# Patient Record
Sex: Female | Born: 1950
Health system: Southern US, Community
[De-identification: ages and names within clinical notes are randomized; demographics above are authoritative.]

## PROBLEM LIST (undated history)

## (undated) DIAGNOSIS — E785 Hyperlipidemia, unspecified: Secondary | ICD-10-CM

## (undated) DIAGNOSIS — H68009 Unspecified Eustachian salpingitis, unspecified ear: Secondary | ICD-10-CM

## (undated) DIAGNOSIS — J302 Other seasonal allergic rhinitis: Secondary | ICD-10-CM

## (undated) DIAGNOSIS — E559 Vitamin D deficiency, unspecified: Secondary | ICD-10-CM

## (undated) DIAGNOSIS — S8992XA Unspecified injury of left lower leg, initial encounter: Secondary | ICD-10-CM

## (undated) DIAGNOSIS — K760 Fatty (change of) liver, not elsewhere classified: Secondary | ICD-10-CM

## (undated) DIAGNOSIS — Z780 Asymptomatic menopausal state: Secondary | ICD-10-CM

## (undated) DIAGNOSIS — M1711 Unilateral primary osteoarthritis, right knee: Secondary | ICD-10-CM

## (undated) DIAGNOSIS — R7989 Other specified abnormal findings of blood chemistry: Secondary | ICD-10-CM

## (undated) DIAGNOSIS — N959 Unspecified menopausal and perimenopausal disorder: Secondary | ICD-10-CM

## (undated) DIAGNOSIS — E890 Postprocedural hypothyroidism: Secondary | ICD-10-CM

## (undated) DIAGNOSIS — L237 Allergic contact dermatitis due to plants, except food: Secondary | ICD-10-CM

## (undated) DIAGNOSIS — H1031 Unspecified acute conjunctivitis, right eye: Secondary | ICD-10-CM

## (undated) DIAGNOSIS — J329 Chronic sinusitis, unspecified: Secondary | ICD-10-CM

## (undated) DIAGNOSIS — L03316 Cellulitis of umbilicus: Secondary | ICD-10-CM

## (undated) DIAGNOSIS — I10 Essential (primary) hypertension: Secondary | ICD-10-CM

## (undated) DIAGNOSIS — R5383 Other fatigue: Secondary | ICD-10-CM

## (undated) DIAGNOSIS — M2550 Pain in unspecified joint: Secondary | ICD-10-CM

## (undated) DIAGNOSIS — J029 Acute pharyngitis, unspecified: Secondary | ICD-10-CM

## (undated) DIAGNOSIS — G4700 Insomnia, unspecified: Secondary | ICD-10-CM

## (undated) DIAGNOSIS — E1165 Type 2 diabetes mellitus with hyperglycemia: Secondary | ICD-10-CM

## (undated) DIAGNOSIS — T465X5A Adverse effect of other antihypertensive drugs, initial encounter: Secondary | ICD-10-CM

## (undated) DIAGNOSIS — R002 Palpitations: Secondary | ICD-10-CM

## (undated) DIAGNOSIS — I889 Nonspecific lymphadenitis, unspecified: Secondary | ICD-10-CM

## (undated) DIAGNOSIS — E669 Obesity, unspecified: Secondary | ICD-10-CM

## (undated) DIAGNOSIS — M7120 Synovial cyst of popliteal space [Baker], unspecified knee: Secondary | ICD-10-CM

## (undated) DIAGNOSIS — I8390 Asymptomatic varicose veins of unspecified lower extremity: Secondary | ICD-10-CM

## (undated) DIAGNOSIS — L84 Corns and callosities: Secondary | ICD-10-CM

## (undated) HISTORY — DX: Other fatigue: R53.83

## (undated) HISTORY — DX: Asymptomatic varicose veins of unspecified lower extremity: I83.90

## (undated) HISTORY — DX: Unspecified injury of left lower leg, initial encounter: S89.92XA

## (undated) HISTORY — DX: Synovial cyst of popliteal space (Baker), unspecified knee: M71.20

## (undated) HISTORY — DX: Insomnia, unspecified: G47.00

## (undated) HISTORY — DX: Essential (primary) hypertension: I10

## (undated) HISTORY — DX: Vitamin D deficiency, unspecified: E55.9

## (undated) HISTORY — DX: Corns and callosities: L84

## (undated) HISTORY — DX: Unspecified eustachian salpingitis, unspecified ear: H68.009

## (undated) HISTORY — DX: Type 2 diabetes mellitus with hyperglycemia: E11.65

## (undated) HISTORY — DX: Unspecified menopausal and perimenopausal disorder: N95.9

## (undated) HISTORY — DX: Unilateral primary osteoarthritis, right knee: M17.11

## (undated) HISTORY — DX: Unspecified acute conjunctivitis, right eye: H10.31

## (undated) HISTORY — DX: Obesity, unspecified: E66.9

## (undated) HISTORY — DX: Other specified abnormal findings of blood chemistry: R79.89

## (undated) HISTORY — DX: Pain in unspecified joint: M25.50

## (undated) HISTORY — DX: Allergic contact dermatitis due to plants, except food: L23.7

## (undated) HISTORY — DX: Asymptomatic menopausal state: Z78.0

## (undated) HISTORY — PX: DILATION AND CURETTAGE OF UTERUS: SHX78

## (undated) HISTORY — DX: Hyperlipidemia, unspecified: E78.5

## (undated) HISTORY — DX: Fatty (change of) liver, not elsewhere classified: K76.0

## (undated) HISTORY — DX: Other seasonal allergic rhinitis: J30.2

## (undated) HISTORY — DX: Acute pharyngitis, unspecified: J02.9

## (undated) HISTORY — DX: Palpitations: R00.2

## (undated) HISTORY — DX: Nonspecific lymphadenitis, unspecified: I88.9

## (undated) HISTORY — DX: Chronic sinusitis, unspecified: J32.9

## (undated) HISTORY — DX: Postprocedural hypothyroidism: E89.0

## (undated) HISTORY — DX: Other fatigue: T46.5X5A

## (undated) HISTORY — DX: Cellulitis of umbilicus: L03.316

---

## 2007-06-23 ENCOUNTER — Encounter: Payer: Self-pay | Admitting: Endocrinology

## 2007-06-23 ENCOUNTER — Other Ambulatory Visit: Admission: RE | Admit: 2007-06-23 | Discharge: 2007-06-23 | Payer: Self-pay | Admitting: Endocrinology

## 2007-06-23 ENCOUNTER — Ambulatory Visit: Payer: Self-pay | Admitting: Endocrinology

## 2007-06-23 DIAGNOSIS — E039 Hypothyroidism, unspecified: Secondary | ICD-10-CM | POA: Insufficient documentation

## 2007-06-23 HISTORY — DX: Hypothyroidism, unspecified: E03.9

## 2007-06-25 DIAGNOSIS — E119 Type 2 diabetes mellitus without complications: Secondary | ICD-10-CM

## 2007-06-25 DIAGNOSIS — I10 Essential (primary) hypertension: Secondary | ICD-10-CM | POA: Insufficient documentation

## 2007-06-25 HISTORY — DX: Essential (primary) hypertension: I10

## 2007-06-25 HISTORY — DX: Type 2 diabetes mellitus without complications: E11.9

## 2007-06-29 ENCOUNTER — Telehealth: Payer: Self-pay | Admitting: Endocrinology

## 2007-07-24 ENCOUNTER — Encounter: Payer: Self-pay | Admitting: Endocrinology

## 2007-07-31 ENCOUNTER — Encounter: Payer: Self-pay | Admitting: Internal Medicine

## 2007-08-02 ENCOUNTER — Ambulatory Visit: Payer: Self-pay | Admitting: Endocrinology

## 2007-08-02 DIAGNOSIS — E890 Postprocedural hypothyroidism: Secondary | ICD-10-CM

## 2007-08-02 HISTORY — DX: Postprocedural hypothyroidism: E89.0

## 2016-02-24 DIAGNOSIS — Z6829 Body mass index (BMI) 29.0-29.9, adult: Secondary | ICD-10-CM | POA: Diagnosis not present

## 2016-02-24 DIAGNOSIS — L237 Allergic contact dermatitis due to plants, except food: Secondary | ICD-10-CM | POA: Diagnosis not present

## 2016-04-30 DIAGNOSIS — E119 Type 2 diabetes mellitus without complications: Secondary | ICD-10-CM | POA: Diagnosis not present

## 2016-04-30 DIAGNOSIS — E039 Hypothyroidism, unspecified: Secondary | ICD-10-CM | POA: Diagnosis not present

## 2016-04-30 DIAGNOSIS — E785 Hyperlipidemia, unspecified: Secondary | ICD-10-CM | POA: Diagnosis not present

## 2016-04-30 DIAGNOSIS — E559 Vitamin D deficiency, unspecified: Secondary | ICD-10-CM | POA: Diagnosis not present

## 2016-05-05 DIAGNOSIS — J309 Allergic rhinitis, unspecified: Secondary | ICD-10-CM | POA: Diagnosis not present

## 2016-05-05 DIAGNOSIS — I10 Essential (primary) hypertension: Secondary | ICD-10-CM | POA: Diagnosis not present

## 2016-05-05 DIAGNOSIS — E039 Hypothyroidism, unspecified: Secondary | ICD-10-CM | POA: Diagnosis not present

## 2016-05-05 DIAGNOSIS — E785 Hyperlipidemia, unspecified: Secondary | ICD-10-CM | POA: Diagnosis not present

## 2016-05-05 DIAGNOSIS — E559 Vitamin D deficiency, unspecified: Secondary | ICD-10-CM | POA: Diagnosis not present

## 2016-05-05 DIAGNOSIS — E119 Type 2 diabetes mellitus without complications: Secondary | ICD-10-CM | POA: Diagnosis not present

## 2016-08-03 DIAGNOSIS — Z23 Encounter for immunization: Secondary | ICD-10-CM | POA: Diagnosis not present

## 2016-11-29 DIAGNOSIS — M25511 Pain in right shoulder: Secondary | ICD-10-CM | POA: Diagnosis not present

## 2016-12-30 DIAGNOSIS — I10 Essential (primary) hypertension: Secondary | ICD-10-CM | POA: Diagnosis not present

## 2016-12-30 DIAGNOSIS — E119 Type 2 diabetes mellitus without complications: Secondary | ICD-10-CM | POA: Diagnosis not present

## 2016-12-30 DIAGNOSIS — E785 Hyperlipidemia, unspecified: Secondary | ICD-10-CM | POA: Diagnosis not present

## 2016-12-30 DIAGNOSIS — E890 Postprocedural hypothyroidism: Secondary | ICD-10-CM | POA: Diagnosis not present

## 2017-01-10 DIAGNOSIS — M25511 Pain in right shoulder: Secondary | ICD-10-CM | POA: Diagnosis not present

## 2017-01-18 DIAGNOSIS — Z1231 Encounter for screening mammogram for malignant neoplasm of breast: Secondary | ICD-10-CM | POA: Diagnosis not present

## 2017-02-10 DIAGNOSIS — N6321 Unspecified lump in the left breast, upper outer quadrant: Secondary | ICD-10-CM | POA: Diagnosis not present

## 2017-02-10 DIAGNOSIS — N6489 Other specified disorders of breast: Secondary | ICD-10-CM | POA: Diagnosis not present

## 2017-02-10 DIAGNOSIS — R928 Other abnormal and inconclusive findings on diagnostic imaging of breast: Secondary | ICD-10-CM | POA: Diagnosis not present

## 2017-03-05 DIAGNOSIS — M7061 Trochanteric bursitis, right hip: Secondary | ICD-10-CM | POA: Diagnosis not present

## 2017-03-29 DIAGNOSIS — H25813 Combined forms of age-related cataract, bilateral: Secondary | ICD-10-CM | POA: Diagnosis not present

## 2017-03-29 DIAGNOSIS — E119 Type 2 diabetes mellitus without complications: Secondary | ICD-10-CM | POA: Diagnosis not present

## 2017-04-11 DIAGNOSIS — Z9181 History of falling: Secondary | ICD-10-CM | POA: Diagnosis not present

## 2017-04-11 DIAGNOSIS — E890 Postprocedural hypothyroidism: Secondary | ICD-10-CM | POA: Diagnosis not present

## 2017-04-11 DIAGNOSIS — Z1389 Encounter for screening for other disorder: Secondary | ICD-10-CM | POA: Diagnosis not present

## 2017-04-11 DIAGNOSIS — E119 Type 2 diabetes mellitus without complications: Secondary | ICD-10-CM | POA: Diagnosis not present

## 2017-04-11 DIAGNOSIS — E785 Hyperlipidemia, unspecified: Secondary | ICD-10-CM | POA: Diagnosis not present

## 2017-04-11 DIAGNOSIS — Z139 Encounter for screening, unspecified: Secondary | ICD-10-CM | POA: Diagnosis not present

## 2017-04-11 DIAGNOSIS — I10 Essential (primary) hypertension: Secondary | ICD-10-CM | POA: Diagnosis not present

## 2017-04-11 DIAGNOSIS — E559 Vitamin D deficiency, unspecified: Secondary | ICD-10-CM | POA: Diagnosis not present

## 2017-07-21 DIAGNOSIS — E669 Obesity, unspecified: Secondary | ICD-10-CM | POA: Diagnosis not present

## 2017-07-21 DIAGNOSIS — E890 Postprocedural hypothyroidism: Secondary | ICD-10-CM | POA: Diagnosis not present

## 2017-07-21 DIAGNOSIS — Z23 Encounter for immunization: Secondary | ICD-10-CM | POA: Diagnosis not present

## 2017-07-21 DIAGNOSIS — I10 Essential (primary) hypertension: Secondary | ICD-10-CM | POA: Diagnosis not present

## 2017-07-21 DIAGNOSIS — E119 Type 2 diabetes mellitus without complications: Secondary | ICD-10-CM | POA: Diagnosis not present

## 2017-07-21 DIAGNOSIS — E785 Hyperlipidemia, unspecified: Secondary | ICD-10-CM | POA: Diagnosis not present

## 2017-07-21 DIAGNOSIS — I8390 Asymptomatic varicose veins of unspecified lower extremity: Secondary | ICD-10-CM | POA: Diagnosis not present

## 2017-07-21 DIAGNOSIS — R928 Other abnormal and inconclusive findings on diagnostic imaging of breast: Secondary | ICD-10-CM | POA: Diagnosis not present

## 2017-08-08 DIAGNOSIS — R928 Other abnormal and inconclusive findings on diagnostic imaging of breast: Secondary | ICD-10-CM | POA: Diagnosis not present

## 2017-08-08 DIAGNOSIS — N6002 Solitary cyst of left breast: Secondary | ICD-10-CM | POA: Diagnosis not present

## 2017-11-10 DIAGNOSIS — E785 Hyperlipidemia, unspecified: Secondary | ICD-10-CM | POA: Diagnosis not present

## 2017-11-10 DIAGNOSIS — E890 Postprocedural hypothyroidism: Secondary | ICD-10-CM | POA: Diagnosis not present

## 2017-11-10 DIAGNOSIS — R945 Abnormal results of liver function studies: Secondary | ICD-10-CM | POA: Diagnosis not present

## 2017-11-10 DIAGNOSIS — E119 Type 2 diabetes mellitus without complications: Secondary | ICD-10-CM | POA: Diagnosis not present

## 2017-11-10 DIAGNOSIS — J302 Other seasonal allergic rhinitis: Secondary | ICD-10-CM | POA: Diagnosis not present

## 2018-02-14 DIAGNOSIS — E785 Hyperlipidemia, unspecified: Secondary | ICD-10-CM | POA: Diagnosis not present

## 2018-02-14 DIAGNOSIS — E890 Postprocedural hypothyroidism: Secondary | ICD-10-CM | POA: Diagnosis not present

## 2018-02-14 DIAGNOSIS — E119 Type 2 diabetes mellitus without complications: Secondary | ICD-10-CM | POA: Diagnosis not present

## 2018-02-14 DIAGNOSIS — E559 Vitamin D deficiency, unspecified: Secondary | ICD-10-CM | POA: Diagnosis not present

## 2018-04-03 DIAGNOSIS — Z6830 Body mass index (BMI) 30.0-30.9, adult: Secondary | ICD-10-CM | POA: Diagnosis not present

## 2018-04-03 DIAGNOSIS — E890 Postprocedural hypothyroidism: Secondary | ICD-10-CM | POA: Diagnosis not present

## 2018-04-10 DIAGNOSIS — J019 Acute sinusitis, unspecified: Secondary | ICD-10-CM | POA: Diagnosis not present

## 2018-04-10 DIAGNOSIS — H1031 Unspecified acute conjunctivitis, right eye: Secondary | ICD-10-CM | POA: Diagnosis not present

## 2018-04-27 DIAGNOSIS — E119 Type 2 diabetes mellitus without complications: Secondary | ICD-10-CM | POA: Diagnosis not present

## 2018-04-27 DIAGNOSIS — H25813 Combined forms of age-related cataract, bilateral: Secondary | ICD-10-CM | POA: Diagnosis not present

## 2018-06-22 DIAGNOSIS — E890 Postprocedural hypothyroidism: Secondary | ICD-10-CM | POA: Diagnosis not present

## 2018-06-22 DIAGNOSIS — E785 Hyperlipidemia, unspecified: Secondary | ICD-10-CM | POA: Diagnosis not present

## 2018-06-22 DIAGNOSIS — Z9181 History of falling: Secondary | ICD-10-CM | POA: Diagnosis not present

## 2018-06-22 DIAGNOSIS — Z1339 Encounter for screening examination for other mental health and behavioral disorders: Secondary | ICD-10-CM | POA: Diagnosis not present

## 2018-06-22 DIAGNOSIS — E119 Type 2 diabetes mellitus without complications: Secondary | ICD-10-CM | POA: Diagnosis not present

## 2018-06-22 DIAGNOSIS — Z23 Encounter for immunization: Secondary | ICD-10-CM | POA: Diagnosis not present

## 2018-06-22 DIAGNOSIS — E559 Vitamin D deficiency, unspecified: Secondary | ICD-10-CM | POA: Diagnosis not present

## 2018-06-22 DIAGNOSIS — Z1331 Encounter for screening for depression: Secondary | ICD-10-CM | POA: Diagnosis not present

## 2018-07-28 DIAGNOSIS — Z1231 Encounter for screening mammogram for malignant neoplasm of breast: Secondary | ICD-10-CM | POA: Diagnosis not present

## 2018-07-28 DIAGNOSIS — M810 Age-related osteoporosis without current pathological fracture: Secondary | ICD-10-CM | POA: Diagnosis not present

## 2018-07-28 DIAGNOSIS — M8589 Other specified disorders of bone density and structure, multiple sites: Secondary | ICD-10-CM | POA: Diagnosis not present

## 2018-10-13 DIAGNOSIS — E119 Type 2 diabetes mellitus without complications: Secondary | ICD-10-CM | POA: Diagnosis not present

## 2018-10-13 DIAGNOSIS — E785 Hyperlipidemia, unspecified: Secondary | ICD-10-CM | POA: Diagnosis not present

## 2018-10-13 DIAGNOSIS — R945 Abnormal results of liver function studies: Secondary | ICD-10-CM | POA: Diagnosis not present

## 2018-10-13 DIAGNOSIS — E890 Postprocedural hypothyroidism: Secondary | ICD-10-CM | POA: Diagnosis not present

## 2018-12-27 DIAGNOSIS — E785 Hyperlipidemia, unspecified: Secondary | ICD-10-CM | POA: Diagnosis not present

## 2018-12-27 DIAGNOSIS — Z1231 Encounter for screening mammogram for malignant neoplasm of breast: Secondary | ICD-10-CM | POA: Diagnosis not present

## 2018-12-27 DIAGNOSIS — Z1331 Encounter for screening for depression: Secondary | ICD-10-CM | POA: Diagnosis not present

## 2018-12-27 DIAGNOSIS — Z136 Encounter for screening for cardiovascular disorders: Secondary | ICD-10-CM | POA: Diagnosis not present

## 2018-12-27 DIAGNOSIS — Z0000 Encounter for general adult medical examination without abnormal findings: Secondary | ICD-10-CM | POA: Diagnosis not present

## 2018-12-27 DIAGNOSIS — Z9181 History of falling: Secondary | ICD-10-CM | POA: Diagnosis not present

## 2019-01-22 DIAGNOSIS — S8992XA Unspecified injury of left lower leg, initial encounter: Secondary | ICD-10-CM | POA: Diagnosis not present

## 2019-01-22 DIAGNOSIS — M79605 Pain in left leg: Secondary | ICD-10-CM | POA: Diagnosis not present

## 2019-02-26 DIAGNOSIS — E890 Postprocedural hypothyroidism: Secondary | ICD-10-CM | POA: Diagnosis not present

## 2019-02-26 DIAGNOSIS — Z6827 Body mass index (BMI) 27.0-27.9, adult: Secondary | ICD-10-CM | POA: Diagnosis not present

## 2019-02-26 DIAGNOSIS — R002 Palpitations: Secondary | ICD-10-CM | POA: Diagnosis not present

## 2019-03-12 DIAGNOSIS — I889 Nonspecific lymphadenitis, unspecified: Secondary | ICD-10-CM | POA: Diagnosis not present

## 2019-03-12 DIAGNOSIS — J329 Chronic sinusitis, unspecified: Secondary | ICD-10-CM | POA: Diagnosis not present

## 2019-03-27 DIAGNOSIS — E890 Postprocedural hypothyroidism: Secondary | ICD-10-CM | POA: Diagnosis not present

## 2019-03-27 DIAGNOSIS — E785 Hyperlipidemia, unspecified: Secondary | ICD-10-CM | POA: Diagnosis not present

## 2019-03-27 DIAGNOSIS — Z23 Encounter for immunization: Secondary | ICD-10-CM | POA: Diagnosis not present

## 2019-03-27 DIAGNOSIS — E119 Type 2 diabetes mellitus without complications: Secondary | ICD-10-CM | POA: Diagnosis not present

## 2019-03-27 DIAGNOSIS — R7989 Other specified abnormal findings of blood chemistry: Secondary | ICD-10-CM | POA: Diagnosis not present

## 2019-03-27 DIAGNOSIS — Z139 Encounter for screening, unspecified: Secondary | ICD-10-CM | POA: Diagnosis not present

## 2019-05-25 DIAGNOSIS — H25813 Combined forms of age-related cataract, bilateral: Secondary | ICD-10-CM | POA: Diagnosis not present

## 2019-05-25 DIAGNOSIS — E119 Type 2 diabetes mellitus without complications: Secondary | ICD-10-CM | POA: Diagnosis not present

## 2019-08-06 DIAGNOSIS — Z23 Encounter for immunization: Secondary | ICD-10-CM | POA: Diagnosis not present

## 2019-08-06 DIAGNOSIS — E785 Hyperlipidemia, unspecified: Secondary | ICD-10-CM | POA: Diagnosis not present

## 2019-08-06 DIAGNOSIS — R7989 Other specified abnormal findings of blood chemistry: Secondary | ICD-10-CM | POA: Diagnosis not present

## 2019-08-06 DIAGNOSIS — Z1231 Encounter for screening mammogram for malignant neoplasm of breast: Secondary | ICD-10-CM | POA: Diagnosis not present

## 2019-08-06 DIAGNOSIS — E890 Postprocedural hypothyroidism: Secondary | ICD-10-CM | POA: Diagnosis not present

## 2019-08-06 DIAGNOSIS — E119 Type 2 diabetes mellitus without complications: Secondary | ICD-10-CM | POA: Diagnosis not present

## 2019-08-16 DIAGNOSIS — Z1231 Encounter for screening mammogram for malignant neoplasm of breast: Secondary | ICD-10-CM | POA: Diagnosis not present

## 2019-12-11 DIAGNOSIS — R7989 Other specified abnormal findings of blood chemistry: Secondary | ICD-10-CM | POA: Diagnosis not present

## 2019-12-11 DIAGNOSIS — E785 Hyperlipidemia, unspecified: Secondary | ICD-10-CM | POA: Diagnosis not present

## 2019-12-11 DIAGNOSIS — E119 Type 2 diabetes mellitus without complications: Secondary | ICD-10-CM | POA: Diagnosis not present

## 2019-12-11 DIAGNOSIS — E890 Postprocedural hypothyroidism: Secondary | ICD-10-CM | POA: Diagnosis not present

## 2019-12-31 DIAGNOSIS — Z0000 Encounter for general adult medical examination without abnormal findings: Secondary | ICD-10-CM | POA: Diagnosis not present

## 2019-12-31 DIAGNOSIS — Z9181 History of falling: Secondary | ICD-10-CM | POA: Diagnosis not present

## 2019-12-31 DIAGNOSIS — E785 Hyperlipidemia, unspecified: Secondary | ICD-10-CM | POA: Diagnosis not present

## 2019-12-31 DIAGNOSIS — N959 Unspecified menopausal and perimenopausal disorder: Secondary | ICD-10-CM | POA: Diagnosis not present

## 2019-12-31 DIAGNOSIS — Z1331 Encounter for screening for depression: Secondary | ICD-10-CM | POA: Diagnosis not present

## 2020-04-14 DIAGNOSIS — Z6829 Body mass index (BMI) 29.0-29.9, adult: Secondary | ICD-10-CM | POA: Diagnosis not present

## 2020-04-14 DIAGNOSIS — Z139 Encounter for screening, unspecified: Secondary | ICD-10-CM | POA: Diagnosis not present

## 2020-04-14 DIAGNOSIS — R7989 Other specified abnormal findings of blood chemistry: Secondary | ICD-10-CM | POA: Diagnosis not present

## 2020-04-14 DIAGNOSIS — E119 Type 2 diabetes mellitus without complications: Secondary | ICD-10-CM | POA: Diagnosis not present

## 2020-04-14 DIAGNOSIS — E890 Postprocedural hypothyroidism: Secondary | ICD-10-CM | POA: Diagnosis not present

## 2020-04-14 DIAGNOSIS — G4700 Insomnia, unspecified: Secondary | ICD-10-CM | POA: Diagnosis not present

## 2020-04-14 DIAGNOSIS — E785 Hyperlipidemia, unspecified: Secondary | ICD-10-CM | POA: Diagnosis not present

## 2020-04-14 DIAGNOSIS — E559 Vitamin D deficiency, unspecified: Secondary | ICD-10-CM | POA: Diagnosis not present

## 2020-07-15 DIAGNOSIS — Z23 Encounter for immunization: Secondary | ICD-10-CM | POA: Diagnosis not present

## 2020-08-18 DIAGNOSIS — E890 Postprocedural hypothyroidism: Secondary | ICD-10-CM | POA: Diagnosis not present

## 2020-08-18 DIAGNOSIS — E119 Type 2 diabetes mellitus without complications: Secondary | ICD-10-CM | POA: Diagnosis not present

## 2020-08-18 DIAGNOSIS — E785 Hyperlipidemia, unspecified: Secondary | ICD-10-CM | POA: Diagnosis not present

## 2020-08-18 DIAGNOSIS — G4700 Insomnia, unspecified: Secondary | ICD-10-CM | POA: Diagnosis not present

## 2020-08-18 DIAGNOSIS — R7989 Other specified abnormal findings of blood chemistry: Secondary | ICD-10-CM | POA: Diagnosis not present

## 2020-08-18 DIAGNOSIS — E559 Vitamin D deficiency, unspecified: Secondary | ICD-10-CM | POA: Diagnosis not present

## 2020-08-18 DIAGNOSIS — Z6829 Body mass index (BMI) 29.0-29.9, adult: Secondary | ICD-10-CM | POA: Diagnosis not present

## 2020-08-18 DIAGNOSIS — E1165 Type 2 diabetes mellitus with hyperglycemia: Secondary | ICD-10-CM | POA: Diagnosis not present

## 2020-09-16 DIAGNOSIS — Z1231 Encounter for screening mammogram for malignant neoplasm of breast: Secondary | ICD-10-CM | POA: Diagnosis not present

## 2020-12-10 DIAGNOSIS — Z01818 Encounter for other preprocedural examination: Secondary | ICD-10-CM | POA: Diagnosis not present

## 2020-12-22 DIAGNOSIS — G4700 Insomnia, unspecified: Secondary | ICD-10-CM | POA: Diagnosis not present

## 2020-12-22 DIAGNOSIS — E785 Hyperlipidemia, unspecified: Secondary | ICD-10-CM | POA: Diagnosis not present

## 2020-12-22 DIAGNOSIS — E559 Vitamin D deficiency, unspecified: Secondary | ICD-10-CM | POA: Diagnosis not present

## 2020-12-22 DIAGNOSIS — E890 Postprocedural hypothyroidism: Secondary | ICD-10-CM | POA: Diagnosis not present

## 2020-12-22 DIAGNOSIS — R7989 Other specified abnormal findings of blood chemistry: Secondary | ICD-10-CM | POA: Diagnosis not present

## 2020-12-22 DIAGNOSIS — E1165 Type 2 diabetes mellitus with hyperglycemia: Secondary | ICD-10-CM | POA: Diagnosis not present

## 2020-12-22 DIAGNOSIS — R002 Palpitations: Secondary | ICD-10-CM | POA: Diagnosis not present

## 2020-12-22 DIAGNOSIS — Z6829 Body mass index (BMI) 29.0-29.9, adult: Secondary | ICD-10-CM | POA: Diagnosis not present

## 2020-12-30 ENCOUNTER — Encounter: Payer: Self-pay | Admitting: Cardiology

## 2020-12-30 DIAGNOSIS — E785 Hyperlipidemia, unspecified: Secondary | ICD-10-CM | POA: Insufficient documentation

## 2020-12-30 DIAGNOSIS — E890 Postprocedural hypothyroidism: Secondary | ICD-10-CM

## 2020-12-30 DIAGNOSIS — E559 Vitamin D deficiency, unspecified: Secondary | ICD-10-CM | POA: Insufficient documentation

## 2020-12-30 DIAGNOSIS — I10 Essential (primary) hypertension: Secondary | ICD-10-CM

## 2020-12-30 DIAGNOSIS — J302 Other seasonal allergic rhinitis: Secondary | ICD-10-CM

## 2020-12-30 DIAGNOSIS — Z780 Asymptomatic menopausal state: Secondary | ICD-10-CM | POA: Insufficient documentation

## 2020-12-30 DIAGNOSIS — E1165 Type 2 diabetes mellitus with hyperglycemia: Secondary | ICD-10-CM | POA: Insufficient documentation

## 2020-12-30 DIAGNOSIS — M7120 Synovial cyst of popliteal space [Baker], unspecified knee: Secondary | ICD-10-CM | POA: Insufficient documentation

## 2020-12-30 DIAGNOSIS — K760 Fatty (change of) liver, not elsewhere classified: Secondary | ICD-10-CM | POA: Insufficient documentation

## 2020-12-30 DIAGNOSIS — M1711 Unilateral primary osteoarthritis, right knee: Secondary | ICD-10-CM | POA: Insufficient documentation

## 2021-01-06 DIAGNOSIS — L237 Allergic contact dermatitis due to plants, except food: Secondary | ICD-10-CM | POA: Insufficient documentation

## 2021-01-06 DIAGNOSIS — J029 Acute pharyngitis, unspecified: Secondary | ICD-10-CM | POA: Insufficient documentation

## 2021-01-06 DIAGNOSIS — L84 Corns and callosities: Secondary | ICD-10-CM | POA: Insufficient documentation

## 2021-01-06 DIAGNOSIS — N959 Unspecified menopausal and perimenopausal disorder: Secondary | ICD-10-CM | POA: Insufficient documentation

## 2021-01-06 DIAGNOSIS — Z803 Family history of malignant neoplasm of breast: Secondary | ICD-10-CM | POA: Diagnosis not present

## 2021-01-06 DIAGNOSIS — L03316 Cellulitis of umbilicus: Secondary | ICD-10-CM | POA: Insufficient documentation

## 2021-01-06 DIAGNOSIS — I8390 Asymptomatic varicose veins of unspecified lower extremity: Secondary | ICD-10-CM | POA: Insufficient documentation

## 2021-01-06 DIAGNOSIS — E669 Obesity, unspecified: Secondary | ICD-10-CM | POA: Insufficient documentation

## 2021-01-06 DIAGNOSIS — I10 Essential (primary) hypertension: Secondary | ICD-10-CM | POA: Diagnosis not present

## 2021-01-06 DIAGNOSIS — T465X5A Adverse effect of other antihypertensive drugs, initial encounter: Secondary | ICD-10-CM | POA: Insufficient documentation

## 2021-01-06 DIAGNOSIS — R7989 Other specified abnormal findings of blood chemistry: Secondary | ICD-10-CM | POA: Insufficient documentation

## 2021-01-06 DIAGNOSIS — K648 Other hemorrhoids: Secondary | ICD-10-CM | POA: Diagnosis not present

## 2021-01-06 DIAGNOSIS — H68009 Unspecified Eustachian salpingitis, unspecified ear: Secondary | ICD-10-CM | POA: Insufficient documentation

## 2021-01-06 DIAGNOSIS — S8992XA Unspecified injury of left lower leg, initial encounter: Secondary | ICD-10-CM | POA: Insufficient documentation

## 2021-01-06 DIAGNOSIS — E119 Type 2 diabetes mellitus without complications: Secondary | ICD-10-CM | POA: Diagnosis not present

## 2021-01-06 DIAGNOSIS — R002 Palpitations: Secondary | ICD-10-CM | POA: Insufficient documentation

## 2021-01-06 DIAGNOSIS — K5730 Diverticulosis of large intestine without perforation or abscess without bleeding: Secondary | ICD-10-CM | POA: Diagnosis not present

## 2021-01-06 DIAGNOSIS — G4700 Insomnia, unspecified: Secondary | ICD-10-CM | POA: Insufficient documentation

## 2021-01-06 DIAGNOSIS — M2550 Pain in unspecified joint: Secondary | ICD-10-CM | POA: Insufficient documentation

## 2021-01-06 DIAGNOSIS — Z1211 Encounter for screening for malignant neoplasm of colon: Secondary | ICD-10-CM | POA: Diagnosis not present

## 2021-01-06 DIAGNOSIS — E039 Hypothyroidism, unspecified: Secondary | ICD-10-CM | POA: Diagnosis not present

## 2021-01-06 DIAGNOSIS — J329 Chronic sinusitis, unspecified: Secondary | ICD-10-CM | POA: Insufficient documentation

## 2021-01-06 DIAGNOSIS — I889 Nonspecific lymphadenitis, unspecified: Secondary | ICD-10-CM | POA: Insufficient documentation

## 2021-01-06 DIAGNOSIS — R5383 Other fatigue: Secondary | ICD-10-CM | POA: Insufficient documentation

## 2021-01-06 DIAGNOSIS — H1031 Unspecified acute conjunctivitis, right eye: Secondary | ICD-10-CM | POA: Insufficient documentation

## 2021-01-07 ENCOUNTER — Other Ambulatory Visit: Payer: Self-pay

## 2021-01-07 ENCOUNTER — Ambulatory Visit: Payer: PPO | Admitting: Cardiology

## 2021-01-07 VITALS — BP 140/82 | HR 69 | Ht 67.0 in | Wt 177.0 lb

## 2021-01-07 DIAGNOSIS — R0789 Other chest pain: Secondary | ICD-10-CM

## 2021-01-07 DIAGNOSIS — I10 Essential (primary) hypertension: Secondary | ICD-10-CM | POA: Diagnosis not present

## 2021-01-07 DIAGNOSIS — R011 Cardiac murmur, unspecified: Secondary | ICD-10-CM | POA: Diagnosis not present

## 2021-01-07 DIAGNOSIS — E785 Hyperlipidemia, unspecified: Secondary | ICD-10-CM | POA: Diagnosis not present

## 2021-01-07 MED ORDER — METOPROLOL TARTRATE 100 MG PO TABS: ORAL_TABLET | ORAL | 0 refills | Status: DC

## 2021-01-07 MED ORDER — NITROGLYCERIN 0.4 MG SL SUBL: 0.4000 mg | SUBLINGUAL_TABLET | SUBLINGUAL | 3 refills | Status: DC | PRN

## 2021-01-07 NOTE — Patient Instructions (Addendum)
Medication Instructions:  Your physician has recommended you make the following change in your medication:  START: Nitroglycerin 0.4 mg take one tablet by mouth every 5 minutes up to three times as needed for chest pain.  *If you need a refill on your cardiac medications before your next appointment, please call your pharmacy*   Lab Work: Your physician recommends that you return for lab work: 3-7 days before CT: BMET, Mag If you have labs (blood work) drawn today and your tests are completely normal, you will receive your results only by: Marland Kitchen MyChart Message (if you have MyChart) OR . A paper copy in the mail If you have any lab test that is abnormal or we need to change your treatment, we will call you to review the results.   Testing/Procedures: Your physician has requested that you have an echocardiogram. Echocardiography is a painless test that uses sound waves to create images of your heart. It provides your doctor with information about the size and shape of your heart and how well your heart's chambers and valves are working. This procedure takes approximately one hour. There are no restrictions for this procedure.  Your cardiac CT will be scheduled at:  [facility] [address] [place],  [address] [phone]  If scheduled at [facility], please arrive at the [facility] main entrance (entrance A) of [facility] 30 minutes prior to test start time. Proceed to the [facility] Radiology Department (first floor) to check-in and test prep.  Please follow these instructions carefully (unless otherwise directed):  On the Night Before the Test: . Be sure to Drink plenty of water. . Do not consume any caffeinated/decaffeinated beverages or chocolate 12 hours prior to your test. . Do not take any antihistamines 12 hours prior to your test.  On the Day of the Test: . Drink plenty of water until 1 hour prior to the test. . Do not eat any food 4  hours prior to the test. . You may take your regular medications prior to the test.  . Take metoprolol (Lopressor) two hours prior to test. . HOLD Hydrochlorothiazide morning of the test. . FEMALES- please wear underwire-free bra if available       After the Test: . Drink plenty of water. . After receiving IV contrast, you may experience a mild flushed feeling. This is normal. . On occasion, you may experience a mild rash up to 24 hours after the test. This is not dangerous. If this occurs, you can take Benadryl 25 mg and increase your fluid intake. . If you experience trouble breathing, this can be serious. If it is severe call 911 IMMEDIATELY. If it is mild, please call our office. . If you take any of these medications: Metformin, please do not take 48 hours after completing test unless otherwise instructed.   Once we have confirmed authorization from your insurance company, we will call you to set up a date and time for your test. Based on how quickly your insurance processes prior authorizations requests, please allow up to 4 weeks to be contacted for scheduling your Cardiac CT appointment. Be advised that routine Cardiac CT appointments could be scheduled as many as 8 weeks after your provider has ordered it.  For non-scheduling related questions, please contact the cardiac imaging nurse navigator should you have any questions/concerns: Marchia Bond, Cardiac Imaging Nurse Navigator Gordy Clement, Cardiac Imaging Nurse Navigator [facility] Direct Office Dial: [phone]  For scheduling needs, including cancellations and rescheduling, please call [place], [phone].   Follow-Up: At [facility], you and your health needs are our priority.  As part of our continuing mission to provide you with exceptional heart care, we have created designated Provider Care Teams.  These Care Teams include your primary Cardiologist (physician) and Advanced Practice  Providers (APPs -  Physician Assistants and Nurse Practitioners) who all work together to provide you with the care you need, when you need it.  We recommend signing up for the patient portal called "MyChart".  Sign up information is provided on this After Visit Summary.  MyChart is used to connect with patients for Virtual Visits (Telemedicine).  Patients are able to view lab/test results, encounter notes, upcoming appointments, etc.  Non-urgent messages can be sent to your provider as well.   To learn more about what you can do with MyChart, go to [email or web address].    Your next appointment:   3 month(s)  The format for your next appointment:   In Person  Provider:   Berniece Salines, DO   Other Instructions  Echocardiogram An echocardiogram is a test that uses sound waves (ultrasound) to produce images of the heart. Images from an echocardiogram can provide important information about:  Heart size and shape.  The size and thickness and movement of your heart's walls.  Heart muscle function and strength.  Heart valve function or if you have stenosis. Stenosis is when the heart valves are too narrow.  If blood is flowing backward through the heart valves (regurgitation).  A tumor or infectious growth around the heart valves.  Areas of heart muscle that are not working well because of poor blood flow or injury from a heart attack.  Aneurysm detection. An aneurysm is a weak or damaged part of an artery wall. The wall bulges out from the normal force of blood pumping through the body. Tell a health care provider about:  Any allergies you have.  All medicines you are taking, including vitamins, herbs, eye drops, creams, and over-the-counter medicines.  Any blood disorders you have.  Any surgeries you have had.  Any medical conditions you have.  Whether you are pregnant or may be pregnant. What are the risks? Generally, this is a safe test. However, problems may occur,  including an allergic reaction to dye (contrast) that may be used during the test. What happens before the test? No specific preparation is needed. You may eat and drink normally. What happens during the test?  You will take off your clothes from the waist up and put on a hospital gown.  Electrodes or electrocardiogram (ECG)patches may be placed on your chest. The electrodes or patches are then connected to a device that monitors your heart rate and rhythm.  You will lie down on a table for an ultrasound exam. A gel will be applied to your chest to help sound waves pass through your skin.  A handheld device, called a transducer, will be pressed against your chest and moved over your heart. The transducer produces sound waves that travel to your heart and bounce back (or "echo" back) to the transducer. These sound waves will be captured in real-time and changed into images of your heart that can be viewed on a video monitor. The images will be recorded on a computer and reviewed by your health care provider.  You may be asked to change positions or hold your breath for a short time. This makes it easier to get  different views or better views of your heart.  In some cases, you may receive contrast through an IV in one of your veins. This can improve the quality of the pictures from your heart. The procedure may vary among health care providers and hospitals.   What can I expect after the test? You may return to your normal, everyday life, including diet, activities, and medicines, unless your health care provider tells you not to do that. Follow these instructions at home:  It is up to you to get the results of your test. Ask your health care provider, or the department that is doing the test, when your results will be ready.  Keep all follow-up visits. This is important. Summary  An echocardiogram is a test that uses sound waves (ultrasound) to produce images of the heart.  Images from an  echocardiogram can provide important information about the size and shape of your heart, heart muscle function, heart valve function, and other possible heart problems.  You do not need to do anything to prepare before this test. You may eat and drink normally.  After the echocardiogram is completed, you may return to your normal, everyday life, unless your health care provider tells you not to do that. This information is not intended to replace advice given to you by your health care provider. Make sure you discuss any questions you have with your health care provider. Document Revised: 04/22/2020 Document Reviewed: 04/22/2020 Elsevier Patient Education  2021 [email or web address]        FDA-cleared, clinical grade mobile EKG monitor: Jodelle Red is the most clinically-validated mobile EKG used by the world's leading cardiac care medical professionals With Basic service, know instantly if your heart rhythm is normal or if atrial fibrillation is detected, and email the last single EKG recording to yourself or your doctor Premium service, available for purchase through the [employer] app for $9.99 per month or $99 per year, includes unlimited history and storage of your EKG recordings, a monthly EKG summary report to share with your doctor, along with the ability to track your blood pressure, activity and weight Includes one K[employer]iaMobile phone clip FREE SHIPPING: Standard delivery 1-3 business days. Orders placed by 11:00am PST will ship that afternoon. Otherwise, will ship next business day. All orders ship via [employer] from [place], [place]

## 2021-01-07 NOTE — Progress Notes (Signed)
Cardiology Office Note:    Date:  01/07/2021   ID:  Cheryl Valenzuela, DOB 1950/12/04, MRN [id number]  PCP:  Lowella Dandy, NP  Cardiologist:  Berniece Salines, DO  Electrophysiologist:  None   Referring MD: Lowella Dandy, NP   Has been having some chest pain  History of Present Illness:    Cheryl Valenzuela is a 70 y.o. female with a hx of diabetes mellitus, hypertension, hyperlipidemia is here today to be evaluated for intermittent chest pain.  For about 4 weeks now, the patient says she has had this midsternal chest sensation which she described as dull and nonradiating.  It comes and goes.  Sometimes it last for few seconds or maybe a few minutes and then resolved.  Nothing makes it better or worse.  She discussed this with her PCP who recommended her to see cardiology.  She is not having the pain today in the office.  Past Medical History:  Diagnosis Date  . Acute conjunctivitis of right eye   . Adult-onset obesity   . Allergic contact dermatitis due to plant   . Anti-hypertensive induced fatigue   . Benign essential hypertension   . Cellulitis of umbilicus   . Cyst, Baker's knee   . Dyslipidemia   . Elevated liver function tests   . Eustachian catarrh   . Injury of left lower extremity   . Insomnia, unspecified   . Lymphadenitis   . Menopausal disorder   . Menopause   . Nonalcoholic fatty liver disease   . Osteoarthritis of right knee   . Pain in joint, multiple sites   . Palpitations   . Pharyngitis   . Post-operative hypothyroidism   . Pre-ulcerative corn or callous   . Seasonal allergies   . Sinusitis   . Type 2 diabetes mellitus with hyperglycemia, without long-term current use of insulin ([place])   . Varicose veins without complication   . Vitamin D deficiency     Past Surgical History:  Procedure Laterality Date  . ABDOMINAL HYSTERECTOMY  1990  . CHOLECYSTECTOMY  2005  . DILATION AND CURETTAGE OF UTERUS     multiple  . THYROIDECTOMY  2008    Current  Medications: Current Meds  Medication Sig  . aspirin EC 81 MG tablet Take 81 mg by mouth daily. Swallow whole.  Marland Kitchen atorvastatin (LIPITOR) 10 MG tablet Take 10 mg by mouth daily.  . Calcium Carb-Cholecalciferol (CALCIUM-VITAMIN D3) 600-200 MG-UNIT TABS Take 1 tablet by mouth 2 (two) times daily.  . Cholecalciferol (VITAMIN D3) 125 MCG (5000 UT) TABS Take 5,000 Units by mouth daily.  . fluticasone (FLONASE) 50 MCG/ACT nasal spray Place 2 sprays into both nostrils daily.  Marland Kitchen glimepiride (AMARYL) 4 MG tablet Take 4 mg by mouth daily with breakfast.  . Krill Oil 350 MG CAPS Take 1 mg by mouth daily.  Marland Kitchen levothyroxine (SYNTHROID) 125 MCG tablet Take 125 mcg by mouth daily before breakfast.  . losartan-hydrochlorothiazide (HYZAAR) 50-12.5 MG tablet Take 1 tablet by mouth daily.  . metFORMIN (GLUCOPHAGE-XR) 750 MG 24 hr tablet Take 750 mg by mouth 2 (two) times daily with a meal.  . metoprolol tartrate (LOPRESSOR) 100 MG tablet Take 2 hours prior to CT  . montelukast (SINGULAIR) 10 MG tablet Take 10 mg by mouth at bedtime as needed for allergies.  . Multiple Vitamins-Minerals (CENTRUM SILVER PO) Take 1 tablet by mouth daily.  . nitroGLYCERIN (NITROSTAT) 0.4 MG SL tablet Place 1 tablet (0.4 mg total) under the tongue every  5 (five) minutes as needed for chest pain.  Marland Kitchen ondansetron (ZOFRAN-ODT) 4 MG disintegrating tablet Take 4 mg by mouth every 8 (eight) hours as needed for nausea or vomiting.     Allergies:   Codeine, Lisinopril, Plendil [felodipine], and Sulfa antibiotics   Social History   Socioeconomic History  . Marital status: Married    Spouse name: Not on file  . Number of children: Not on file  . Years of education: Not on file  . Highest education level: Not on file  Occupational History  . Not on file  Tobacco Use  . Smoking status: Never Smoker  . Smokeless tobacco: Not on file  Substance and Sexual Activity  . Alcohol use: Never  . Drug use: Never  . Sexual activity: Not on file   Other Topics Concern  . Not on file  Social History Narrative  . Not on file   Social Determinants of Health   Financial Resource Strain: Not on file  Food Insecurity: Not on file  Transportation Needs: Not on file  Physical Activity: Not on file  Stress: Not on file  Social Connections: Not on file     Family History: The patient's family history includes Arthritis in her mother and sister; Breast cancer in her mother; Diabetes in her brother, father, mother, and sister; Heart attack in her father; Heart disease in her father; Hypertension in her brother, mother, and sister; Leukemia in her brother; Thyroid disease in her mother.  ROS:   Review of Systems  Constitution: Negative for decreased appetite, fever and weight gain.  HENT: Negative for congestion, ear discharge, hoarse voice and sore throat.   Eyes: Negative for discharge, redness, vision loss in right eye and visual halos.  Cardiovascular: Reports chest pain.  Negative for dyspnea on exertion, leg swelling, orthopnea and palpitations.  Respiratory: Negative for cough, hemoptysis, shortness of breath and snoring.   Endocrine: Negative for heat intolerance and polyphagia.  Hematologic/Lymphatic: Negative for bleeding problem. Does not bruise/bleed easily.  Skin: Negative for flushing, nail changes, rash and suspicious lesions.  Musculoskeletal: Negative for arthritis, joint pain, muscle cramps, myalgias, neck pain and stiffness.  Gastrointestinal: Negative for abdominal pain, bowel incontinence, diarrhea and excessive appetite.  Genitourinary: Negative for decreased libido, genital sores and incomplete emptying.  Neurological: Negative for brief paralysis, focal weakness, headaches and loss of balance.  Psychiatric/Behavioral: Negative for altered mental status, depression and suicidal ideas.  Allergic/Immunologic: Negative for HIV exposure and persistent infections.    EKGs/Labs/Other Studies Reviewed:    The  following studies were reviewed today:   EKG:  The ekg ordered today demonstrates sinus rhythm, heart rate 69 bpm with T wave changes suggesting inferolateral wall ischemia and underlying right bundle branch block.  Recent Labs: No results found for requested labs within last 8760 hours.  Recent Lipid Panel No results found for: CHOL, TRIG, HDL, CHOLHDL, VLDL, LDLCALC, LDLDIRECT  Physical Exam:    VS:  BP 140/82 (BP Location: Right Arm)   Pulse 69   Ht 5\' 7"  (1.702 m)   Wt 177 lb (80.3 kg)   SpO2 99%   BMI 27.72 kg/m     Wt Readings from Last 3 Encounters:  01/07/21 177 lb (80.3 kg)  12/22/20 177 lb (80.3 kg)  06/23/07 200 lb (90.7 kg)     GEN: Well nourished, well developed in no acute distress HEENT: Normal NECK: No JVD; No carotid bruits LYMPHATICS: No lymphadenopathy CARDIAC: S1S2 noted,RRR, 2/6 holosystolic murmurs, rubs, gallops RESPIRATORY:  Clear to auscultation without rales, wheezing or rhonchi  ABDOMEN: Soft, non-tender, non-distended, +bowel sounds, no guarding. EXTREMITIES: No edema, No cyanosis, no clubbing MUSCULOSKELETAL:  No deformity  SKIN: Warm and dry NEUROLOGIC:  Alert and oriented x 3, non-focal PSYCHIATRIC:  Normal affect, good insight  ASSESSMENT:    1. Murmur, cardiac   2. Other chest pain   3. Hypertension, unspecified type   4. Hyperlipidemia, unspecified hyperlipidemia type    PLAN:     The symptoms of chest pain is concerning, this patient does have intermediate risk for coronary artery disease and at this time I would like to pursue an ischemic evaluation in this patient.  Shared decision a coronary CTA at this time is appropriate.  I have discussed with the patient about the testing.  The patient has no IV contrast allergy and is agreeable to proceed with this test.  Echocardiogram will also be done to assess LV/RV function and any other structure abnormalities, in the setting of her murmur.  This is being managed by his primary care  doctor.  No adjustments for antidiabetic medications were made today.  Her blood pressure is elevated in the office today.  Her target is less than 130/80.  I discussed with the patient she will minimize any salt intake and we will continue to monitor.  If this does not improve the goal will be to increase her antihypertensive medication.  Hyperlipidemia - continue with current statin medication.  Lipid profile done December 22, 2020 showed HDL 47, LDL 54, total cholesterol 134, triglyceride 207.  BMP  reviewed all within normal limits which was done on 12/22/2020.  The patient is in agreement with the above plan. The patient left the office in stable condition.  The patient will follow up in   Medication Adjustments/Labs and Tests Ordered: Current medicines are reviewed at length with the patient today.  Concerns regarding medicines are outlined above.  Orders Placed This Encounter  Procedures  . CT CORONARY MORPH W/CTA COR W/SCORE W/CA W/CM &/OR WO/CM  . CT CORONARY FRACTIONAL FLOW RESERVE DATA PREP  . CT CORONARY FRACTIONAL FLOW RESERVE FLUID ANALYSIS  . Basic metabolic panel  . Magnesium  . EKG 12-Lead  . ECHOCARDIOGRAM COMPLETE   Meds ordered this encounter  Medications  . nitroGLYCERIN (NITROSTAT) 0.4 MG SL tablet    Sig: Place 1 tablet (0.4 mg total) under the tongue every 5 (five) minutes as needed for chest pain.    Dispense:  90 tablet    Refill:  3  . metoprolol tartrate (LOPRESSOR) 100 MG tablet    Sig: Take 2 hours prior to CT    Dispense:  1 tablet    Refill:  0    Patient Instructions   Medication Instructions:  Your physician has recommended you make the following change in your medication:  START: Nitroglycerin 0.4 mg take one tablet by mouth every 5 minutes up to three times as needed for chest pain.  *If you need a refill on your cardiac medications before your next appointment, please call your pharmacy*   Lab Work: Your physician recommends that you  return for lab work: 3-7 days before CT: BMET, Mag If you have labs (blood work) drawn today and your tests are completely normal, you will receive your results only by: Marland Kitchen MyChart Message (if you have MyChart) OR . A paper copy in the mail If you have any lab test that is abnormal or we need to change your treatment, we will  call you to review the results.   Testing/Procedures: Your physician has requested that you have an echocardiogram. Echocardiography is a painless test that uses sound waves to create images of your heart. It provides your doctor with information about the size and shape of your heart and how well your heart's chambers and valves are working. This procedure takes approximately one hour. There are no restrictions for this procedure.  Your cardiac CT will be scheduled at:  [facility] [address] [place], [place] [address] [phone]  If scheduled at [facility], please arrive at the [facility] main entrance (entrance A) of [facility] 30 minutes prior to test start time. Proceed to the [facility] Radiology Department (first floor) to check-in and test prep.  Please follow these instructions carefully (unless otherwise directed):  On the Night Before the Test: . Be sure to Drink plenty of water. . Do not consume any caffeinated/decaffeinated beverages or chocolate 12 hours prior to your test. . Do not take any antihistamines 12 hours prior to your test.  On the Day of the Test: . Drink plenty of water until 1 hour prior to the test. . Do not eat any food 4 hours prior to the test. . You may take your regular medications prior to the test.  . Take metoprolol (Lopressor) two hours prior to test. . HOLD Hydrochlorothiazide morning of the test. . FEMALES- please wear underwire-free bra if available       After the Test: . Drink plenty of water. . After receiving IV contrast, you may experience a mild flushed feeling. This is  normal. . On occasion, you may experience a mild rash up to 24 hours after the test. This is not dangerous. If this occurs, you can take Benadryl 25 mg and increase your fluid intake. . If you experience trouble breathing, this can be serious. If it is severe call 911 IMMEDIATELY. If it is mild, please call our office. . If you take any of these medications: Metformin, please do not take 48 hours after completing test unless otherwise instructed.   Once we have confirmed authorization from your insurance company, we will call you to set up a date and time for your test. Based on how quickly your insurance processes prior authorizations requests, please allow up to 4 weeks to be contacted for scheduling your Cardiac CT appointment. Be advised that routine Cardiac CT appointments could be scheduled as many as 8 weeks after your provider has ordered it.  For non-scheduling related questions, please contact the cardiac imaging nurse navigator should you have any questions/concerns: Marchia Bond, Cardiac Imaging Nurse Navigator Gordy Clement, Cardiac Imaging Nurse Navigator [facility] Direct Office Dial: [phone]   For scheduling needs, including cancellations and rescheduling, please call [place], [phone].   Follow-Up: At [facility], you and your health needs are our priority.  As part of our continuing mission to provide you with exceptional heart care, we have created designated Provider Care Teams.  These Care Teams include your primary Cardiologist (physician) and Advanced Practice Providers (APPs -  Physician Assistants and Nurse Practitioners) who all work together to provide you with the care you need, when you need it.  We recommend signing up for the patient portal called "MyChart".  Sign up information is provided on this After Visit Summary.  MyChart is used to connect with patients for Virtual Visits (Telemedicine).  Patients are able to view  lab/test results, encounter  notes, upcoming appointments, etc.  Non-urgent messages can be sent to your provider as well.   To learn more about what you can do with MyChart, go to [email or web address].    Your next appointment:   3 month(s)  The format for your next appointment:   In Person  Provider:   Berniece Salines, DO   Other Instructions  Echocardiogram An echocardiogram is a test that uses sound waves (ultrasound) to produce images of the heart. Images from an echocardiogram can provide important information about:  Heart size and shape.  The size and thickness and movement of your heart's walls.  Heart muscle function and strength.  Heart valve function or if you have stenosis. Stenosis is when the heart valves are too narrow.  If blood is flowing backward through the heart valves (regurgitation).  A tumor or infectious growth around the heart valves.  Areas of heart muscle that are not working well because of poor blood flow or injury from a heart attack.  Aneurysm detection. An aneurysm is a weak or damaged part of an artery wall. The wall bulges out from the normal force of blood pumping through the body. Tell a health care provider about:  Any allergies you have.  All medicines you are taking, including vitamins, herbs, eye drops, creams, and over-the-counter medicines.  Any blood disorders you have.  Any surgeries you have had.  Any medical conditions you have.  Whether you are pregnant or may be pregnant. What are the risks? Generally, this is a safe test. However, problems may occur, including an allergic reaction to dye (contrast) that may be used during the test. What happens before the test? No specific preparation is needed. You may eat and drink normally. What happens during the test?  You will take off your clothes from the waist up and put on a hospital gown.  Electrodes or electrocardiogram (ECG)patches may be placed on your chest. The  electrodes or patches are then connected to a device that monitors your heart rate and rhythm.  You will lie down on a table for an ultrasound exam. A gel will be applied to your chest to help sound waves pass through your skin.  A handheld device, called a transducer, will be pressed against your chest and moved over your heart. The transducer produces sound waves that travel to your heart and bounce back (or "echo" back) to the transducer. These sound waves will be captured in real-time and changed into images of your heart that can be viewed on a video monitor. The images will be recorded on a computer and reviewed by your health care provider.  You may be asked to change positions or hold your breath for a short time. This makes it easier to get different views or better views of your heart.  In some cases, you may receive contrast through an IV in one of your veins. This can improve the quality of the pictures from your heart. The procedure may vary among health care providers and hospitals.   What can I expect after the test? You may return to your normal, everyday life, including diet, activities, and medicines, unless your health care provider tells you not to do that. Follow these instructions at home:  It is up to you to get the results of your test. Ask your health care provider, or the department that is doing the test, when your results will be ready.  Keep all follow-up visits. This is important. Summary  An echocardiogram  is a test that uses sound waves (ultrasound) to produce images of the heart.  Images from an echocardiogram can provide important information about the size and shape of your heart, heart muscle function, heart valve function, and other possible heart problems.  You do not need to do anything to prepare before this test. You may eat and drink normally.  After the echocardiogram is completed, you may return to your normal, everyday life, unless your health  care provider tells you not to do that. This information is not intended to replace advice given to you by your health care provider. Make sure you discuss any questions you have with your health care provider. Document Revised: 04/22/2020 Document Reviewed: 04/22/2020 Elsevier Patient Education  2021 [place] [email or web address]        FDA-cleared, clinical grade mobile EKG monitor: Jodelle Red is the most clinically-validated mobile EKG used by the world's leading cardiac care medical professionals With Basic service, know instantly if your heart rhythm is normal or if atrial fibrillation is detected, and email the last single EKG recording to yourself or your doctor Premium service, available for purchase through the Kardia app for $9.99 per month or $99 per year, includes unlimited history and storage of your EKG recordings, a monthly EKG summary report to share with your doctor, along with the ability to track your blood pressure, activity and weight Includes one KardiaMobile phone clip FREE SHIPPING: Standard delivery 1-3 business days. Orders placed by 11:00am PST will ship that afternoon. Otherwise, will ship next business day. All orders ship via [employer] from [place], [place]         Adopting a Healthy Lifestyle.  Know what a healthy weight is for you (roughly BMI <25) and aim to maintain this   Aim for 7+ servings of fruits and vegetables daily   65-80+ fluid ounces of water or unsweet tea for healthy kidneys   Limit to max 1 drink of alcohol per day; avoid smoking/tobacco   Limit animal fats in diet for cholesterol and heart health - choose grass fed whenever available   Avoid highly processed foods, and foods high in saturated/trans fats   Aim for low stress - take time to unwind and care for your mental health   Aim for 150 min of moderate intensity exercise weekly for heart health, and weights twice weekly for bone  health   Aim for 7-9 hours of sleep daily   When it comes to diets, agreement about the perfect plan isnt easy to find, even among the experts. Experts at the [employer] developed an idea known as the Healthy Eating Plate. Just imagine a plate divided into logical, healthy portions.   The emphasis is on diet quality:   Load up on vegetables and fruits - one-half of your plate: Aim for color and variety, and remember that potatoes dont count.   Go for whole grains - one-quarter of your plate: Whole wheat, barley, wheat berries, quinoa, oats, brown rice, and foods made with them. If you want pasta, go with whole wheat pasta.   Protein power - one-quarter of your plate: Fish, chicken, beans, and nuts are all healthy, versatile protein sources. Limit red meat.   The diet, however, does go beyond the plate, offering a few other suggestions.   Use healthy plant oils, such as olive, canola, soy, corn, sunflower and peanut. Check the labels, and avoid partially hydrogenated oil, which have unhealthy trans fats.   If  youre thirsty, drink water. Coffee and tea are good in moderation, but skip sugary drinks and limit milk and dairy products to one or two daily servings.   The type of carbohydrate in the diet is more important than the amount. Some sources of carbohydrates, such as vegetables, fruits, whole grains, and beans-are healthier than others.   Finally, stay active  Signed, Berniece Salines, DO  01/07/2021 12:26 PM    [place]

## 2021-01-12 DIAGNOSIS — E785 Hyperlipidemia, unspecified: Secondary | ICD-10-CM | POA: Diagnosis not present

## 2021-01-12 DIAGNOSIS — I10 Essential (primary) hypertension: Secondary | ICD-10-CM | POA: Diagnosis not present

## 2021-01-13 LAB — MAGNESIUM: Magnesium: 1.9 mg/dL (ref 1.6–2.3)

## 2021-01-13 LAB — BASIC METABOLIC PANEL
BUN/Creatinine Ratio: 22 (ref 12–28)
BUN: 12 mg/dL (ref 8–27)
CO2: 24 mmol/L (ref 20–29)
Calcium: 9.5 mg/dL (ref 8.7–10.3)
Chloride: 94 mmol/L — ABNORMAL LOW (ref 96–106)
Creatinine, Ser: 0.55 mg/dL — ABNORMAL LOW (ref 0.57–1.00)
Glucose: 123 mg/dL — ABNORMAL HIGH (ref 65–99)
Potassium: 4.2 mmol/L (ref 3.5–5.2)
Sodium: 135 mmol/L (ref 134–144)
eGFR: 98 mL/min/{1.73_m2} (ref >59)

## 2021-01-15 ENCOUNTER — Telehealth (HOSPITAL_COMMUNITY): Payer: Self-pay | Admitting: *Deleted

## 2021-01-15 NOTE — Telephone Encounter (Signed)
Entered in error

## 2021-01-15 NOTE — Telephone Encounter (Signed)
Attempted to call patient regarding upcoming cardiac CT appointment. °Left message on voicemail with name and callback number ° °Cyleigh Massaro RN Navigator Cardiac Imaging °[facility] °[phone] Office °[phone] Cell ° °

## 2021-01-15 NOTE — Telephone Encounter (Signed)
Reaching out to patient to offer assistance regarding upcoming cardiac imaging study; pt verbalizes understanding of appt date/time, parking situation and where to check in, pre-test NPO status and medications ordered, and verified current allergies; name and call back number provided for further questions should they arise  Traniya Prichett RN Navigator Cardiac Imaging  [facility] and V[facility] [phone] office [phone] cell  Pt to take 100mg metoprolol tartrate 2 hours prior to cardiac CT scan. 

## 2021-01-16 ENCOUNTER — Other Ambulatory Visit: Payer: Self-pay

## 2021-01-16 ENCOUNTER — Ambulatory Visit (HOSPITAL_COMMUNITY)
Admission: RE | Admit: 2021-01-16 | Discharge: 2021-01-16 | Disposition: A | Discharge status: Home / Self Care | Payer: PPO | Source: Ambulatory Visit | Attending: Cardiology | Admitting: Cardiology

## 2021-01-16 DIAGNOSIS — Z006 Encounter for examination for normal comparison and control in clinical research program: Secondary | ICD-10-CM

## 2021-01-16 DIAGNOSIS — R0789 Other chest pain: Secondary | ICD-10-CM | POA: Diagnosis not present

## 2021-01-16 DIAGNOSIS — I2510 Atherosclerotic heart disease of native coronary artery without angina pectoris: Secondary | ICD-10-CM | POA: Diagnosis not present

## 2021-01-16 MED ORDER — NITROGLYCERIN 0.4 MG SL SUBL: 0.8000 mg | SUBLINGUAL_TABLET | Freq: Once | SUBLINGUAL | Status: DC

## 2021-01-16 MED ORDER — IOHEXOL 350 MG/ML SOLN
80.0000 mL | Freq: Once | INTRAVENOUS | Status: AC | PRN
  Administered 2021-01-16: 80 mL via INTRAVENOUS

## 2021-01-16 MED ORDER — NITROGLYCERIN 0.4 MG SL SUBL
SUBLINGUAL_TABLET | SUBLINGUAL | Status: AC
  Filled 2021-01-16: qty 2

## 2021-01-16 NOTE — Research (Signed)
IDENTIFY Informed Consent                  Subject Name: Cheryl Valenzuela   Subject met inclusion and exclusion criteria.  The informed consent form, study requirements and expectations were reviewed with the subject and questions and concerns were addressed prior to the signing of the consent form.  The subject verbalized understanding of the trial requirements.  The subject agreed to participate in the IDENTIFY trial and signed the informed consent at 12:34PM on 01/16/21.  The informed consent was obtained prior to performance of any protocol-specific procedures for the subject.  A copy of the signed informed consent was given to the subject and a copy was placed in the subject's medical record.   Sallye Ober , Research Assistant

## 2021-02-04 ENCOUNTER — Ambulatory Visit (INDEPENDENT_AMBULATORY_CARE_PROVIDER_SITE_OTHER): Payer: PPO

## 2021-02-04 ENCOUNTER — Other Ambulatory Visit: Payer: Self-pay

## 2021-02-04 DIAGNOSIS — R011 Cardiac murmur, unspecified: Secondary | ICD-10-CM

## 2021-02-04 LAB — ECHOCARDIOGRAM COMPLETE
Area-P 1/2: 3.1 cm2
S' Lateral: 3 cm

## 2021-02-04 NOTE — Progress Notes (Signed)
Complete echocardiogram performed.  Jimmy Celvin Taney RDCS, RVT  

## 2021-02-05 ENCOUNTER — Telehealth: Payer: Self-pay | Admitting: Cardiology

## 2021-02-05 NOTE — Telephone Encounter (Signed)
Tried to call patient several times, the phone just gave a busy tone. Unable to leave a message.

## 2021-02-05 NOTE — Telephone Encounter (Signed)
Patient is requesting to speak with Dr. Terrial Rhodes nurse. She states she is having episodes of anxiety in the middle of the night, causing her to lose sleep. She states she wakes up in a panic, feeling extremely anxious. She is unsure whether this is cardiac related, but she would like to make Dr. Harriet Masson aware anyway. She states she monitors her HR with her Fitbit and her HR rarely gets above 100 BPM. She reports her BP is also good, but she does not have any readings.

## 2021-02-05 NOTE — Telephone Encounter (Signed)
Spoke with patient about the symptoms at night. She states she has been having problems sleeping. "It's just a funny feeling I have a night" She states she has been using melatonin and Advil PM and it helps some but I have the craziest dreams. She states she took Sleep-eze last night and it helped her sleep better. Patient encouraged to take Sleep-eze as directed and document how she feels in the morning and how she sleeps at night until she sees Dr. Harriet Masson at her next appointment. She verbalized understanding and thanked me for calling her.

## 2021-02-14 DIAGNOSIS — S6392XA Sprain of unspecified part of left wrist and hand, initial encounter: Secondary | ICD-10-CM | POA: Diagnosis not present

## 2021-02-16 DIAGNOSIS — E785 Hyperlipidemia, unspecified: Secondary | ICD-10-CM | POA: Diagnosis not present

## 2021-02-16 DIAGNOSIS — Z1331 Encounter for screening for depression: Secondary | ICD-10-CM | POA: Diagnosis not present

## 2021-02-16 DIAGNOSIS — Z9181 History of falling: Secondary | ICD-10-CM | POA: Diagnosis not present

## 2021-02-16 DIAGNOSIS — Z0000 Encounter for general adult medical examination without abnormal findings: Secondary | ICD-10-CM | POA: Diagnosis not present

## 2021-02-17 ENCOUNTER — Ambulatory Visit: Payer: PPO | Admitting: Cardiology

## 2021-02-17 ENCOUNTER — Encounter: Payer: Self-pay | Admitting: Cardiology

## 2021-02-17 ENCOUNTER — Other Ambulatory Visit: Payer: Self-pay

## 2021-02-17 VITALS — BP 150/74 | HR 84 | Ht 67.0 in | Wt 182.0 lb

## 2021-02-17 DIAGNOSIS — R002 Palpitations: Secondary | ICD-10-CM

## 2021-02-17 DIAGNOSIS — I2510 Atherosclerotic heart disease of native coronary artery without angina pectoris: Secondary | ICD-10-CM

## 2021-02-17 DIAGNOSIS — I10 Essential (primary) hypertension: Secondary | ICD-10-CM | POA: Insufficient documentation

## 2021-02-17 DIAGNOSIS — E118 Type 2 diabetes mellitus with unspecified complications: Secondary | ICD-10-CM

## 2021-02-17 DIAGNOSIS — E785 Hyperlipidemia, unspecified: Secondary | ICD-10-CM

## 2021-02-17 HISTORY — DX: Atherosclerotic heart disease of native coronary artery without angina pectoris: I25.10

## 2021-02-17 HISTORY — DX: Type 2 diabetes mellitus with unspecified complications: E11.8

## 2021-02-17 HISTORY — DX: Essential (primary) hypertension: I10

## 2021-02-17 MED ORDER — DILTIAZEM HCL ER COATED BEADS 120 MG PO CP24: 120.0000 mg | ORAL_CAPSULE | Freq: Every day | ORAL | 3 refills | Status: DC

## 2021-02-17 NOTE — Progress Notes (Signed)
Cardiology Office Note:    Date:  02/17/2021   ID:  Cheryl Valenzuela, DOB Aug 20, 1951, MRN [id number]  PCP:  Lowella Dandy, NP  Cardiologist:  Berniece Salines, DO  Electrophysiologist:  None   Referring MD: Lowella Dandy, NP   " I am having some palpitations - wakes me up at night"  History of Present Illness:    Cheryl Valenzuela is a 70 y.o. female with a hx of mild coronary artery disease seen on coronary CTA, diabetes mellitus, hypertension, hyperlipidemia.  The patient presents as she tells me she has been experiencing some intermittent palpitations.  She reports that this usually occurs at night.  Wakes her up from sleep.  She is concerned about this.  She denies any chest pain, shortness of breath.  Past Medical History:  Diagnosis Date  . Acute conjunctivitis of right eye   . Adult-onset obesity   . Allergic contact dermatitis due to plant   . Anti-hypertensive induced fatigue   . Benign essential hypertension   . Cellulitis of umbilicus   . Cyst, Baker's knee   . Dyslipidemia   . Elevated liver function tests   . Eustachian catarrh   . Injury of left lower extremity   . Insomnia, unspecified   . Lymphadenitis   . Menopausal disorder   . Menopause   . Nonalcoholic fatty liver disease   . Osteoarthritis of right knee   . Pain in joint, multiple sites   . Palpitations   . Pharyngitis   . Post-operative hypothyroidism   . Pre-ulcerative corn or callous   . Seasonal allergies   . Sinusitis   . Type 2 diabetes mellitus with hyperglycemia, without long-term current use of insulin ([place])   . Varicose veins without complication   . Vitamin D deficiency     Past Surgical History:  Procedure Laterality Date  . ABDOMINAL HYSTERECTOMY  1990  . CHOLECYSTECTOMY  2005  . DILATION AND CURETTAGE OF UTERUS     multiple  . THYROIDECTOMY  2008    Current Medications: Current Meds  Medication Sig  . aspirin EC 81 MG tablet Take 81 mg by mouth daily. Swallow whole.  Marland Kitchen atorvastatin  (LIPITOR) 10 MG tablet Take 10 mg by mouth daily.  . Calcium Carb-Cholecalciferol (CALCIUM-VITAMIN D3) 600-200 MG-UNIT TABS Take 1 tablet by mouth 2 (two) times daily.  . Cholecalciferol (VITAMIN D3) 125 MCG (5000 UT) TABS Take 5,000 Units by mouth daily.  Marland Kitchen diltiazem (CARDIZEM CD) 120 MG 24 hr capsule Take 1 capsule (120 mg total) by mouth daily.  . fluticasone (FLONASE) 50 MCG/ACT nasal spray Place 2 sprays into both nostrils daily.  Marland Kitchen glimepiride (AMARYL) 4 MG tablet Take 4 mg by mouth daily with breakfast.  . Krill Oil 350 MG CAPS Take 1 mg by mouth daily.  Marland Kitchen levothyroxine (SYNTHROID) 125 MCG tablet Take 125 mcg by mouth daily before breakfast.  . losartan-hydrochlorothiazide (HYZAAR) 50-12.5 MG tablet Take 1 tablet by mouth daily.  . metFORMIN (GLUCOPHAGE-XR) 750 MG 24 hr tablet Take 750 mg by mouth 2 (two) times daily with a meal.  . metoprolol tartrate (LOPRESSOR) 100 MG tablet Take 2 hours prior to CT  . montelukast (SINGULAIR) 10 MG tablet Take 10 mg by mouth at bedtime as needed for allergies.  . Multiple Vitamins-Minerals (CENTRUM SILVER PO) Take 1 tablet by mouth daily.  . nitroGLYCERIN (NITROSTAT) 0.4 MG SL tablet Place 1 tablet (0.4 mg total) under the tongue every 5 (five) minutes as needed for  chest pain.  Marland Kitchen ondansetron (ZOFRAN-ODT) 4 MG disintegrating tablet Take 4 mg by mouth every 8 (eight) hours as needed for nausea or vomiting.     Allergies:   Codeine, Lisinopril, Plendil [felodipine], and Sulfa antibiotics   Social History   Socioeconomic History  . Marital status: Married    Spouse name: Not on file  . Number of children: Not on file  . Years of education: Not on file  . Highest education level: Not on file  Occupational History  . Not on file  Tobacco Use  . Smoking status: Never Smoker  . Smokeless tobacco: Not on file  Substance and Sexual Activity  . Alcohol use: Never  . Drug use: Never  . Sexual activity: Not on file  Other Topics Concern  . Not on  file  Social History Narrative  . Not on file   Social Determinants of Health   Financial Resource Strain: Not on file  Food Insecurity: Not on file  Transportation Needs: Not on file  Physical Activity: Not on file  Stress: Not on file  Social Connections: Not on file     Family History: The patient's family history includes Arthritis in her mother and sister; Breast cancer in her mother; Diabetes in her brother, father, mother, and sister; Heart attack in her father; Heart disease in her father; Hypertension in her brother, mother, and sister; Leukemia in her brother; Thyroid disease in her mother.  ROS:   Review of Systems  Constitution: Negative for decreased appetite, fever and weight gain.  HENT: Negative for congestion, ear discharge, hoarse voice and sore throat.   Eyes: Negative for discharge, redness, vision loss in right eye and visual halos.  Cardiovascular: Negative for chest pain, dyspnea on exertion, leg swelling, orthopnea and palpitations.  Respiratory: Negative for cough, hemoptysis, shortness of breath and snoring.   Endocrine: Negative for heat intolerance and polyphagia.  Hematologic/Lymphatic: Negative for bleeding problem. Does not bruise/bleed easily.  Skin: Negative for flushing, nail changes, rash and suspicious lesions.  Musculoskeletal: Negative for arthritis, joint pain, muscle cramps, myalgias, neck pain and stiffness.  Gastrointestinal: Negative for abdominal pain, bowel incontinence, diarrhea and excessive appetite.  Genitourinary: Negative for decreased libido, genital sores and incomplete emptying.  Neurological: Negative for brief paralysis, focal weakness, headaches and loss of balance.  Psychiatric/Behavioral: Negative for altered mental status, depression and suicidal ideas.  Allergic/Immunologic: Negative for HIV exposure and persistent infections.    EKGs/Labs/Other Studies Reviewed:    The following studies were reviewed today:   EKG:  None today  CCTA 01/16/2021 Aorta: Normal size. Mild aortic root calcifications. No dissection.  Aortic Valve:  Trileaflet.  No calcifications.  Coronary Arteries:  Normal coronary origin.  Right dominance.  RCA is a large dominant artery that gives rise to PDA and PLA. There is minimal (<24%) soft plaque in the mid RCA. The distal RCA has a mild (25-49%) calcified plaque. The proximal RCA with no plaques.  Left main is a large artery that gives rise to LAD, Ramus Intermedius and LCX arteries.  LAD is a large vessel that has no plaque.  Ramus with no plaques.  LCX is a non-dominant artery that gives rise to one large OM1 branch. There is no plaque.  Other findings:  5 pulmonary veins including Right middle pulmonary vein (normal variant) with drainage into the left atrium. No evidence of pulmonary vein stenosis.  Normal left atrial appendage without a thrombus.  Normal size of the pulmonary artery.  IMPRESSION:  1. Coronary calcium score of 31.2. This was 22 percentile for age and sex matched control.  2. Normal coronary origin with right dominance.  3. Mild coronary artery disease. CAD-RADS 2. Mild non-obstructive CAD (25-49%). Consider non-atherosclerotic causes of chest pain. Consider preventive therapy and risk factor modification.  TTE 02/04/2021 IMPRESSIONS  1. Left ventricular ejection fraction, by estimation, is 60 to 65%. The left ventricle has normal function. The left ventricle has no regional wall motion abnormalities. There is moderate concentric left ventricular hypertrophy. Left ventricular  diastolic parameters are consistent with Grade I diastolic dysfunction (impaired relaxation).  2. Right ventricular systolic function is normal. The right ventricular size is normal. There is normal pulmonary artery systolic pressure.  3. The mitral valve is normal in structure. No evidence of mitral valve regurgitation. No evidence of mitral stenosis.  4.  The aortic valve is normal in structure. Aortic valve regurgitation is not visualized. No aortic stenosis is present.  5. The inferior vena cava is normal in size with greater than 50% respiratory variability, suggesting right atrial pressure of 3 mmHg.   FINDINGS  Left Ventricle: Left ventricular ejection fraction, by estimation, is 60 to 65%. The left ventricle has normal function. The left ventricle has no  regional wall motion abnormalities. The left ventricular internal cavity size was normal in size. There is  moderate concentric left ventricular hypertrophy. Left ventricular diastolic parameters are consistent with Grade I diastolic dysfunction (impaired relaxation). Normal left ventricular filling pressure.   Right Ventricle: The right ventricular size is normal. No increase in  right ventricular wall thickness. Right ventricular systolic function is  normal. There is normal pulmonary artery systolic pressure. The tricuspid  regurgitant velocity is 2.57 m/s, and  with an assumed right atrial pressure of 3 mmHg, the estimated right  ventricular systolic pressure is 91.4 mmHg.   Left Atrium: Left atrial size was normal in size.   Right Atrium: Right atrial size was normal in size.   Pericardium: There is no evidence of pericardial effusion.   Mitral Valve: The mitral valve is normal in structure. No evidence of  mitral valve regurgitation. No evidence of mitral valve stenosis.   Tricuspid Valve: The tricuspid valve is normal in structure. Tricuspid  valve regurgitation is mild . No evidence of tricuspid stenosis.   Aortic Valve: The aortic valve is normal in structure. Aortic valve  regurgitation is not visualized. No aortic stenosis is present.   Pulmonic Valve: The pulmonic valve was normal in structure. Pulmonic valve  regurgitation is not visualized. No evidence of pulmonic stenosis.   Aorta: The aortic root and ascending aorta are structurally normal, with  no  evidence of dilitation and the aortic arch was not well visualized.   Venous: A normal flow pattern is recorded from the right upper pulmonary  vein. The inferior vena cava is normal in size with greater than 50%  respiratory variability, suggesting right atrial pressure of 3 mmHg.   IAS/Shunts: No atrial level shunt detected by color flow Doppler.    Recent Labs: 01/12/2021: BUN 12; Creatinine, Ser 0.55; Magnesium 1.9; Potassium 4.2; Sodium 135  Recent Lipid Panel No results found for: CHOL, TRIG, HDL, CHOLHDL, VLDL, LDLCALC, LDLDIRECT  Physical Exam:    VS:  BP (!) 150/74 (BP Location: Right Arm, Patient Position: Sitting, Cuff Size: Normal)   Pulse 84   Ht 5\' 7"  (1.702 m)   Wt 182 lb (82.6 kg)   SpO2 99%   BMI 28.51 kg/m  Wt Readings from Last 3 Encounters:  02/17/21 182 lb (82.6 kg)  01/07/21 177 lb (80.3 kg)  12/22/20 177 lb (80.3 kg)     GEN: Well nourished, well developed in no acute distress HEENT: Normal NECK: No JVD; No carotid bruits LYMPHATICS: No lymphadenopathy CARDIAC: S1S2 noted,RRR, no murmurs, rubs, gallops RESPIRATORY:  Clear to auscultation without rales, wheezing or rhonchi  ABDOMEN: Soft, non-tender, non-distended, +bowel sounds, no guarding. EXTREMITIES: No edema, No cyanosis, no clubbing MUSCULOSKELETAL:  No deformity  SKIN: Warm and dry NEUROLOGIC:  Alert and oriented x 3, non-focal PSYCHIATRIC:  Normal affect, good insight  ASSESSMENT:    1. Dyslipidemia   2. Mild CAD   3. Hypertension, unspecified type   4. Palpitations   5. Type 2 diabetes mellitus with complication, without long-term current use of insulin ([facility])    PLAN:     She is hypertensive and with her intermittent palpitation administered patient on Cardizem 120 mg daily.  Hoping this will help with her symptoms as well as her blood pressure.  If her symptoms are improved we will then consider ZIO monitor  We discussed her testing result for coronary CTA which showed  evidence of mild coronary artery disease for now we will keep the patient on aspirin 81 mg and her atorvastatin 10 mg a day.  Her lipid profile in April 2022 showed LDL 54.  Hyperlipidemia - continue with current statin medication.  In April 2022 HDL 47, LDL 54, total cholesterol 134, triglycerides 207.  Hemoglobin A1c 6.5%  This is being managed by his primary care doctor.  No adjustments for antidiabetic medications were made today.   The patient is in agreement with the above plan. The patient left the office in stable condition.  The patient will follow up in 12 weeks or sooner if needed.   Medication Adjustments/Labs and Tests Ordered: Current medicines are reviewed at length with the patient today.  Concerns regarding medicines are outlined above.  No orders of the defined types were placed in this encounter.  Meds ordered this encounter  Medications  . diltiazem (CARDIZEM CD) 120 MG 24 hr capsule    Sig: Take 1 capsule (120 mg total) by mouth daily.    Dispense:  90 capsule    Refill:  3    Patient Instructions  Medication Instructions:  Your physician has recommended you make the following change in your medication:  START: Diltiazem 120 mg take one tablet by mouth daily at bedtime.  *If you need a refill on your cardiac medications before your next appointment, please call your pharmacy*   Lab Work: None If you have labs (blood work) drawn today and your tests are completely normal, you will receive your results only by: Marland Kitchen MyChart Message (if you have MyChart) OR . A paper copy in the mail If you have any lab test that is abnormal or we need to change your treatment, we will call you to review the results.   Testing/Procedures: NOne   Follow-Up: At [facility], you and your health needs are our priority.  As part of our continuing mission to provide you with exceptional heart care, we have created designated Provider Care Teams.  These Care Teams include your  primary Cardiologist (physician) and Advanced Practice Providers (APPs -  Physician Assistants and Nurse Practitioners) who all work together to provide you with the care you need, when you need it.  We recommend signing up for the patient portal called "MyChart".  Sign up  information is provided on this After Visit Summary.  MyChart is used to connect with patients for Virtual Visits (Telemedicine).  Patients are able to view lab/test results, encounter notes, upcoming appointments, etc.  Non-urgent messages can be sent to your provider as well.   To learn more about what you can do with MyChart, go to [email or web address].    Your next appointment:   12 week(s)  The format for your next appointment:   In Person  Provider:   Berniece Salines, DO   Other Instructions      Adopting a Healthy Lifestyle.  Know what a healthy weight is for you (roughly BMI <25) and aim to maintain this   Aim for 7+ servings of fruits and vegetables daily   65-80+ fluid ounces of water or unsweet tea for healthy kidneys   Limit to max 1 drink of alcohol per day; avoid smoking/tobacco   Limit animal fats in diet for cholesterol and heart health - choose grass fed whenever available   Avoid highly processed foods, and foods high in saturated/trans fats   Aim for low stress - take time to unwind and care for your mental health   Aim for 150 min of moderate intensity exercise weekly for heart health, and weights twice weekly for bone health   Aim for 7-9 hours of sleep daily   When it comes to diets, agreement about the perfect plan isnt easy to find, even among the experts. Experts at the [employer] developed an idea known as the Healthy Eating Plate. Just imagine a plate divided into logical, healthy portions.   The emphasis is on diet quality:   Load up on vegetables and fruits - one-half of your plate: Aim for color and variety, and remember that potatoes dont count.    Go for whole grains - one-quarter of your plate: Whole wheat, barley, wheat berries, quinoa, oats, brown rice, and foods made with them. If you want pasta, go with whole wheat pasta.   Protein power - one-quarter of your plate: Fish, chicken, beans, and nuts are all healthy, versatile protein sources. Limit red meat.   The diet, however, does go beyond the plate, offering a few other suggestions.   Use healthy plant oils, such as olive, canola, soy, corn, sunflower and peanut. Check the labels, and avoid partially hydrogenated oil, which have unhealthy trans fats.   If youre thirsty, drink water. Coffee and tea are good in moderation, but skip sugary drinks and limit milk and dairy products to one or two daily servings.   The type of carbohydrate in the diet is more important than the amount. Some sources of carbohydrates, such as vegetables, fruits, whole grains, and beans-are healthier than others.   Finally, stay active  Signed, Berniece Salines, DO  02/17/2021 10:47 AM    Cheryl Valenzuela

## 2021-02-17 NOTE — Patient Instructions (Signed)
Medication Instructions:  Your physician has recommended you make the following change in your medication:  START: Diltiazem 120 mg take one tablet by mouth daily at bedtime.  *If you need a refill on your cardiac medications before your next appointment, please call your pharmacy*   Lab Work: None If you have labs (blood work) drawn today and your tests are completely normal, you will receive your results only by: Marland Kitchen MyChart Message (if you have MyChart) OR . A paper copy in the mail If you have any lab test that is abnormal or we need to change your treatment, we will call you to review the results.   Testing/Procedures: NOne   Follow-Up: At [facility], you and your health needs are our priority.  As part of our continuing mission to provide you with exceptional heart care, we have created designated Provider Care Teams.  These Care Teams include your primary Cardiologist (physician) and Advanced Practice Providers (APPs -  Physician Assistants and Nurse Practitioners) who all work together to provide you with the care you need, when you need it.  We recommend signing up for the patient portal called "MyChart".  Sign up information is provided on this After Visit Summary.  MyChart is used to connect with patients for Virtual Visits (Telemedicine).  Patients are able to view lab/test results, encounter notes, upcoming appointments, etc.  Non-urgent messages can be sent to your provider as well.   To learn more about what you can do with MyChart, go to [email or web address].    Your next appointment:   12 week(s)  The format for your next appointment:   In Person  Provider:   Berniece Salines, DO   Other Instructions

## 2021-04-16 ENCOUNTER — Telehealth: Payer: Self-pay | Admitting: *Deleted

## 2021-04-16 DIAGNOSIS — Z006 Encounter for examination for normal comparison and control in clinical research program: Secondary | ICD-10-CM

## 2021-04-16 NOTE — Telephone Encounter (Signed)
I called patient for 90-day Identify phone call. I left message for patient to call me back.

## 2021-04-17 ENCOUNTER — Telehealth: Payer: Self-pay

## 2021-04-17 DIAGNOSIS — Z006 Encounter for examination for normal comparison and control in clinical research program: Secondary | ICD-10-CM

## 2021-04-17 NOTE — Telephone Encounter (Signed)
Patient returned our phone call for her 90-day Identify Study follow up phone call. Patient is doing well with no cardiac symptoms at this time. I reminded patient I would call her in May for her 1 year follow-up.

## 2021-04-23 ENCOUNTER — Ambulatory Visit: Payer: PPO | Admitting: Cardiology

## 2021-04-28 DIAGNOSIS — G4700 Insomnia, unspecified: Secondary | ICD-10-CM | POA: Diagnosis not present

## 2021-04-28 DIAGNOSIS — Z6829 Body mass index (BMI) 29.0-29.9, adult: Secondary | ICD-10-CM | POA: Diagnosis not present

## 2021-04-28 DIAGNOSIS — R7989 Other specified abnormal findings of blood chemistry: Secondary | ICD-10-CM | POA: Diagnosis not present

## 2021-04-28 DIAGNOSIS — E1165 Type 2 diabetes mellitus with hyperglycemia: Secondary | ICD-10-CM | POA: Diagnosis not present

## 2021-04-28 DIAGNOSIS — J302 Other seasonal allergic rhinitis: Secondary | ICD-10-CM | POA: Diagnosis not present

## 2021-04-28 DIAGNOSIS — E559 Vitamin D deficiency, unspecified: Secondary | ICD-10-CM | POA: Diagnosis not present

## 2021-04-28 DIAGNOSIS — R002 Palpitations: Secondary | ICD-10-CM | POA: Diagnosis not present

## 2021-04-28 DIAGNOSIS — E890 Postprocedural hypothyroidism: Secondary | ICD-10-CM | POA: Diagnosis not present

## 2021-04-28 DIAGNOSIS — E785 Hyperlipidemia, unspecified: Secondary | ICD-10-CM | POA: Diagnosis not present

## 2021-04-29 DIAGNOSIS — H2513 Age-related nuclear cataract, bilateral: Secondary | ICD-10-CM | POA: Diagnosis not present

## 2021-04-29 DIAGNOSIS — H35373 Puckering of macula, bilateral: Secondary | ICD-10-CM | POA: Diagnosis not present

## 2021-05-13 ENCOUNTER — Other Ambulatory Visit: Payer: Self-pay

## 2021-05-15 ENCOUNTER — Encounter: Payer: Self-pay | Admitting: Cardiology

## 2021-05-15 ENCOUNTER — Other Ambulatory Visit: Payer: Self-pay

## 2021-05-15 ENCOUNTER — Ambulatory Visit: Payer: PPO | Admitting: Cardiology

## 2021-05-15 VITALS — BP 140/60 | HR 68 | Ht 67.0 in | Wt 176.0 lb

## 2021-05-15 DIAGNOSIS — E1165 Type 2 diabetes mellitus with hyperglycemia: Secondary | ICD-10-CM | POA: Diagnosis not present

## 2021-05-15 DIAGNOSIS — I10 Essential (primary) hypertension: Secondary | ICD-10-CM

## 2021-05-15 DIAGNOSIS — I2510 Atherosclerotic heart disease of native coronary artery without angina pectoris: Secondary | ICD-10-CM | POA: Diagnosis not present

## 2021-05-15 NOTE — Patient Instructions (Signed)
Medication Instructions:  Your physician recommends that you continue on your current medications as directed. Please refer to the Current Medication list given to you today.  *If you need a refill on your cardiac medications before your next appointment, please call your pharmacy*   Lab Work: NONE If you have labs (blood work) drawn today and your tests are completely normal, you will receive your results only by: [place] (if you have MyChart) OR A paper copy in the mail If you have any lab test that is abnormal or we need to change your treatment, we will call you to review the results.   Testing/Procedures: NONE   Follow-Up: At [facility], you and your health needs are our priority.  As part of our continuing mission to provide you with exceptional heart care, we have created designated Provider Care Teams.  These Care Teams include your primary Cardiologist (physician) and Advanced Practice Providers (APPs -  Physician Assistants and Nurse Practitioners) who all work together to provide you with the care you need, when you need it.  We recommend signing up for the patient portal called "MyChart".  Sign up information is provided on this After Visit Summary.  MyChart is used to connect with patients for Virtual Visits (Telemedicine).  Patients are able to view lab/test results, encounter notes, upcoming appointments, etc.  Non-urgent messages can be sent to your provider as well.   To learn more about what you can do with MyChart, go to [email or web address].    Follow up as needed

## 2021-05-15 NOTE — Progress Notes (Signed)
Cardiology Office Note:    Date:  05/16/2021   ID:  Cheryl Valenzuela, DOB 1950/12/27, MRN [id number]  PCP:  Lowella Dandy, NP  Cardiologist:  Berniece Salines, DO  Electrophysiologist:  None   Referring MD: Lowella Dandy, NP   Chief Complaint  Patient presents with   Follow-up    History of Present Illness:    Cheryl Valenzuela is a 70 y.o. female with a hx of hx of mild coronary artery disease seen on coronary CTA, diabetes mellitus, hypertension, hyperlipidemia.  Since her last visit she tells me that she has been doing well.  She is happy with the use of cortisone and has not had any palpitations since this was started.  Past Medical History:  Diagnosis Date   Acute conjunctivitis of right eye    Adult-onset obesity    Allergic contact dermatitis due to plant    Anti-hypertensive induced fatigue    Benign essential hypertension    Cellulitis of umbilicus    Cyst, Baker's knee    DIABETES MELLITUS, TYPE II 06/25/2007   Qualifier: Diagnosis of  By: Loanne Drilling MD, Sean A    Dyslipidemia    Elevated liver function tests    Eustachian catarrh    HYPERTENSION 06/25/2007   Qualifier: Diagnosis of  By: Loanne Drilling MD, Sean A    Hypertension 02/17/2021   Injury of left lower extremity    Insomnia, unspecified    Lymphadenitis    Menopausal disorder    Menopause    Mild CAD 99991111   Nonalcoholic fatty liver disease    Osteoarthritis of right knee    Pain in joint, multiple sites    Palpitations    Pharyngitis    Post-operative hypothyroidism    Postsurgical hypothyroidism 08/02/2007   Qualifier: Diagnosis of  By: Loanne Drilling MD, Sean A    Pre-ulcerative corn or callous    Seasonal allergies    Sinusitis    Type 2 diabetes mellitus with complication, without long-term current use of insulin ([place]) 02/17/2021   Type 2 diabetes mellitus with hyperglycemia, without long-term current use of insulin ([place])    Unspecified hypothyroidism 06/23/2007   Centricity Description: HYPOTHYROIDISM NOS Qualifier:  Diagnosis of  By: Loanne Drilling MD, Jacelyn Pi  Centricity Description: HYPOTHYROIDISM Qualifier: Diagnosis of  By: Loanne Drilling MD, Sean A    Varicose veins without complication    Vitamin D deficiency     Past Surgical History:  Procedure Laterality Date   ABDOMINAL HYSTERECTOMY  1990   CHOLECYSTECTOMY  2005   DILATION AND CURETTAGE OF UTERUS     multiple   THYROIDECTOMY  2008    Current Medications: Current Meds  Medication Sig   aspirin EC 81 MG tablet Take 81 mg by mouth daily. Swallow whole.   atorvastatin (LIPITOR) 10 MG tablet Take 10 mg by mouth daily.   Calcium Carb-Cholecalciferol (CALCIUM-VITAMIN D3) 600-200 MG-UNIT TABS Take 1 tablet by mouth 2 (two) times daily.   Cholecalciferol (VITAMIN D3) 125 MCG (5000 UT) TABS Take 5,000 Units by mouth daily.   diltiazem (CARDIZEM CD) 120 MG 24 hr capsule Take 1 capsule (120 mg total) by mouth daily.   fluticasone (FLONASE) 50 MCG/ACT nasal spray Place 2 sprays into both nostrils daily.   glimepiride (AMARYL) 4 MG tablet Take 4 mg by mouth daily with breakfast.   Krill Oil 500 MG CAPS Take 500 mg by mouth daily.   levothyroxine (SYNTHROID) 125 MCG tablet Take 125 mcg by mouth daily before breakfast.   losartan-hydrochlorothiazide (  HYZAAR) 50-12.5 MG tablet Take 1 tablet by mouth daily.   metFORMIN (GLUCOPHAGE-XR) 750 MG 24 hr tablet Take 750 mg by mouth 2 (two) times daily with a meal.   montelukast (SINGULAIR) 10 MG tablet Take 10 mg by mouth at bedtime as needed for allergies.   Multiple Vitamins-Minerals (CENTRUM SILVER PO) Take 1 tablet by mouth daily.   ondansetron (ZOFRAN-ODT) 4 MG disintegrating tablet Take 4 mg by mouth every 8 (eight) hours as needed for nausea or vomiting.     Allergies:   Codeine, Lisinopril, Plendil [felodipine], and Sulfa antibiotics   Social History   Socioeconomic History   Marital status: Married    Spouse name: Not on file   Number of children: Not on file   Years of education: Not on file   Highest  education level: Not on file  Occupational History   Not on file  Tobacco Use   Smoking status: Never   Smokeless tobacco: Never  Substance and Sexual Activity   Alcohol use: Never   Drug use: Never   Sexual activity: Not on file  Other Topics Concern   Not on file  Social History Narrative   Not on file   Social Determinants of Health   Financial Resource Strain: Not on file  Food Insecurity: Not on file  Transportation Needs: Not on file  Physical Activity: Not on file  Stress: Not on file  Social Connections: Not on file     Family History: The patient's family history includes Arthritis in her mother and sister; Breast cancer in her mother; Diabetes in her brother, father, mother, and sister; Heart attack in her father; Heart disease in her father; Hypertension in her brother, mother, and sister; Leukemia in her brother; Thyroid disease in her mother.  ROS:   Review of Systems  Constitution: Negative for decreased appetite, fever and weight gain.  HENT: Negative for congestion, ear discharge, hoarse voice and sore throat.   Eyes: Negative for discharge, redness, vision loss in right eye and visual halos.  Cardiovascular: Negative for chest pain, dyspnea on exertion, leg swelling, orthopnea and palpitations.  Respiratory: Negative for cough, hemoptysis, shortness of breath and snoring.   Endocrine: Negative for heat intolerance and polyphagia.  Hematologic/Lymphatic: Negative for bleeding problem. Does not bruise/bleed easily.  Skin: Negative for flushing, nail changes, rash and suspicious lesions.  Musculoskeletal: Negative for arthritis, joint pain, muscle cramps, myalgias, neck pain and stiffness.  Gastrointestinal: Negative for abdominal pain, bowel incontinence, diarrhea and excessive appetite.  Genitourinary: Negative for decreased libido, genital sores and incomplete emptying.  Neurological: Negative for brief paralysis, focal weakness, headaches and loss of  balance.  Psychiatric/Behavioral: Negative for altered mental status, depression and suicidal ideas.  Allergic/Immunologic: Negative for HIV exposure and persistent infections.    EKGs/Labs/Other Studies Reviewed:    The following studies were reviewed today:   EKG: None today  CCTA 01/16/2021 Aorta: Normal size. Mild aortic root calcifications. No dissection.   Aortic Valve:  Trileaflet.  No calcifications.   Coronary Arteries:  Normal coronary origin.  Right dominance.   RCA is a large dominant artery that gives rise to PDA and PLA. There is minimal (<24%) soft plaque in the mid RCA. The distal RCA has a mild (25-49%) calcified plaque. The proximal RCA with no plaques.   Left main is a large artery that gives rise to LAD, Ramus Intermedius and LCX arteries.   LAD is a large vessel that has no plaque.   Ramus with  no plaques.   LCX is a non-dominant artery that gives rise to one large OM1 branch. There is no plaque.   Other findings:   5 pulmonary veins including Right middle pulmonary vein (normal variant) with drainage into the left atrium. No evidence of pulmonary vein stenosis.   Normal left atrial appendage without a thrombus.   Normal size of the pulmonary artery.   IMPRESSION: 1. Coronary calcium score of 31.2. This was 28 percentile for age and sex matched control.   2. Normal coronary origin with right dominance.   3. Mild coronary artery disease. CAD-RADS 2. Mild non-obstructive CAD (25-49%). Consider non-atherosclerotic causes of chest pain. Consider preventive therapy and risk factor modification.   TTE 02/04/2021 IMPRESSIONS   1. Left ventricular ejection fraction, by estimation, is 60 to 65%. The left ventricle has normal function. The left ventricle has no regional wall motion abnormalities. There is moderate concentric left ventricular hypertrophy. Left ventricular  diastolic parameters are consistent with Grade I diastolic dysfunction (impaired  relaxation).   2. Right ventricular systolic function is normal. The right ventricular size is normal. There is normal pulmonary artery systolic pressure.   3. The mitral valve is normal in structure. No evidence of mitral valve regurgitation. No evidence of mitral stenosis.   4. The aortic valve is normal in structure. Aortic valve regurgitation is not visualized. No aortic stenosis is present.   5. The inferior vena cava is normal in size with greater than 50% respiratory variability, suggesting right atrial pressure of 3 mmHg.   FINDINGS   Left Ventricle: Left ventricular ejection fraction, by estimation, is 60 to 65%. The left ventricle has normal function. The left ventricle has no  regional wall motion abnormalities. The left ventricular internal cavity size was normal in size. There is   moderate concentric left ventricular hypertrophy. Left ventricular diastolic parameters are consistent with Grade I diastolic dysfunction (impaired relaxation). Normal left ventricular filling pressure.   Right Ventricle: The right ventricular size is normal. No increase in  right ventricular wall thickness. Right ventricular systolic function is  normal. There is normal pulmonary artery systolic pressure. The tricuspid  regurgitant velocity is 2.57 m/s, and   with an assumed right atrial pressure of 3 mmHg, the estimated right  ventricular systolic pressure is [id number].   Left Atrium: Left atrial size was normal in size.   Right Atrium: Right atrial size was normal in size.   Pericardium: There is no evidence of pericardial effusion.   Mitral Valve: The mitral valve is normal in structure. No evidence of  mitral valve regurgitation. No evidence of mitral valve stenosis.   Tricuspid Valve: The tricuspid valve is normal in structure. Tricuspid  valve regurgitation is mild . No evidence of tricuspid stenosis.   Aortic Valve: The aortic valve is normal in structure. Aortic valve  regurgitation is  not visualized. No aortic stenosis is present.   Pulmonic Valve: The pulmonic valve was normal in structure. Pulmonic valve  regurgitation is not visualized. No evidence of pulmonic stenosis.   Aorta: The aortic root and ascending aorta are structurally normal, with  no evidence of dilitation and the aortic arch was not well visualized.   Venous: A normal flow pattern is recorded from the right upper pulmonary  vein. The inferior vena cava is normal in size with greater than 50%  respiratory variability, suggesting right atrial pressure of 3 mmHg.   IAS/Shunts: No atrial level shunt detected by color flow Doppler.   Recent  Labs: 01/12/2021: BUN 12; Creatinine, Ser 0.55; Magnesium 1.9; Potassium 4.2; Sodium 135  Recent Lipid Panel No results found for: CHOL, TRIG, HDL, CHOLHDL, VLDL, LDLCALC, LDLDIRECT  Physical Exam:    VS:  BP 140/60 (BP Location: Left Arm, Patient Position: Sitting, Cuff Size: Normal)   Pulse 68   Ht '5\' 7"'$  (1.702 m)   Wt 176 lb (79.8 kg)   SpO2 98%   BMI 27.57 kg/m     Wt Readings from Last 3 Encounters:  05/15/21 176 lb (79.8 kg)  02/17/21 182 lb (82.6 kg)  01/07/21 177 lb (80.3 kg)     GEN: Well nourished, well developed in no acute distress HEENT: Normal NECK: No JVD; No carotid bruits LYMPHATICS: No lymphadenopathy CARDIAC: S1S2 noted,RRR, no murmurs, rubs, gallops RESPIRATORY:  Clear to auscultation without rales, wheezing or rhonchi  ABDOMEN: Soft, non-tender, non-distended, +bowel sounds, no guarding. EXTREMITIES: No edema, No cyanosis, no clubbing MUSCULOSKELETAL:  No deformity  SKIN: Warm and dry NEUROLOGIC:  Alert and oriented x 3, non-focal PSYCHIATRIC:  Normal affect, good insight  ASSESSMENT:    1. Benign essential hypertension   2. Mild CAD   3. Primary hypertension   4. Type 2 diabetes mellitus with hyperglycemia, without long-term current use of insulin (HCC)    PLAN:     1.  She is doing well from a cardiovascular standpoint  there is no changes in her medication regimen at this time.  The patient is in agreement with the above plan. The patient left the office in stable condition.  The patient will follow up in   Medication Adjustments/Labs and Tests Ordered: Current medicines are reviewed at length with the patient today.  Concerns regarding medicines are outlined above.  No orders of the defined types were placed in this encounter.  No orders of the defined types were placed in this encounter.   Patient Instructions  Medication Instructions:  Your physician recommends that you continue on your current medications as directed. Please refer to the Current Medication list given to you today.  *If you need a refill on your cardiac medications before your next appointment, please call your pharmacy*   Lab Work: NONE If you have labs (blood work) drawn today and your tests are completely normal, you will receive your results only by: [place] (if you have MyChart) OR A paper copy in the mail If you have any lab test that is abnormal or we need to change your treatment, we will call you to review the results.   Testing/Procedures: NONE   Follow-Up: At [facility], you and your health needs are our priority.  As part of our continuing mission to provide you with exceptional heart care, we have created designated Provider Care Teams.  These Care Teams include your primary Cardiologist (physician) and Advanced Practice Providers (APPs -  Physician Assistants and Nurse Practitioners) who all work together to provide you with the care you need, when you need it.  We recommend signing up for the patient portal called "MyChart".  Sign up information is provided on this After Visit Summary.  MyChart is used to connect with patients for Virtual Visits (Telemedicine).  Patients are able to view lab/test results, encounter notes, upcoming appointments, etc.  Non-urgent messages can be sent to your provider as  well.   To learn more about what you can do with MyChart, go to [email or web address].    Follow up as needed       Adopting a Healthy Lifestyle.  Know what a healthy weight is for you (roughly BMI <25) and aim to maintain this   Aim for 7+ servings of fruits and vegetables daily   65-80+ fluid ounces of water or unsweet tea for healthy kidneys   Limit to max 1 drink of alcohol per day; avoid smoking/tobacco   Limit animal fats in diet for cholesterol and heart health - choose grass fed whenever available   Avoid highly processed foods, and foods high in saturated/trans fats   Aim for low stress - take time to unwind and care for your mental health   Aim for 150 min of moderate intensity exercise weekly for heart health, and weights twice weekly for bone health   Aim for 7-9 hours of sleep daily   When it comes to diets, agreement about the perfect plan isnt easy to find, even among the experts. Experts at the [place] developed an idea known as the Healthy Eating Plate. Just imagine a plate divided into logical, healthy portions.   The emphasis is on diet quality:   Load up on vegetables and fruits - one-half of your plate: Aim for color and variety, and remember that potatoes dont count.   Go for whole grains - one-quarter of your plate: Whole wheat, barley, wheat berries, quinoa, oats, brown rice, and foods made with them. If you want pasta, go with whole wheat pasta.   Protein power - one-quarter of your plate: Fish, chicken, beans, and nuts are all healthy, versatile protein sources. Limit red meat.   The diet, however, does go beyond the plate, offering a few other suggestions.   Use healthy plant oils, such as olive, canola, soy, corn, sunflower and peanut. Check the labels, and avoid partially hydrogenated oil, which have unhealthy trans fats.   If youre thirsty, drink water. Coffee and tea are good in moderation, but skip sugary drinks  and limit milk and dairy products to one or two daily servings.   The type of carbohydrate in the diet is more important than the amount. Some sources of carbohydrates, such as vegetables, fruits, whole grains, and beans-are healthier than others.   Finally, stay active  Signed, Berniece Salines, DO  05/16/2021 1:29 PM    [facility]

## 2021-07-17 DIAGNOSIS — Z23 Encounter for immunization: Secondary | ICD-10-CM | POA: Diagnosis not present

## 2021-09-01 DIAGNOSIS — Z6828 Body mass index (BMI) 28.0-28.9, adult: Secondary | ICD-10-CM | POA: Diagnosis not present

## 2021-09-01 DIAGNOSIS — B372 Candidiasis of skin and nail: Secondary | ICD-10-CM | POA: Diagnosis not present

## 2021-09-01 DIAGNOSIS — E1165 Type 2 diabetes mellitus with hyperglycemia: Secondary | ICD-10-CM | POA: Diagnosis not present

## 2021-09-01 DIAGNOSIS — E559 Vitamin D deficiency, unspecified: Secondary | ICD-10-CM | POA: Diagnosis not present

## 2021-09-01 DIAGNOSIS — R7989 Other specified abnormal findings of blood chemistry: Secondary | ICD-10-CM | POA: Diagnosis not present

## 2021-09-01 DIAGNOSIS — E785 Hyperlipidemia, unspecified: Secondary | ICD-10-CM | POA: Diagnosis not present

## 2021-09-01 DIAGNOSIS — Z1231 Encounter for screening mammogram for malignant neoplasm of breast: Secondary | ICD-10-CM | POA: Diagnosis not present

## 2021-09-01 DIAGNOSIS — M8589 Other specified disorders of bone density and structure, multiple sites: Secondary | ICD-10-CM | POA: Diagnosis not present

## 2021-09-01 DIAGNOSIS — E890 Postprocedural hypothyroidism: Secondary | ICD-10-CM | POA: Diagnosis not present

## 2021-09-01 DIAGNOSIS — R002 Palpitations: Secondary | ICD-10-CM | POA: Diagnosis not present

## 2021-09-01 DIAGNOSIS — G4700 Insomnia, unspecified: Secondary | ICD-10-CM | POA: Diagnosis not present

## 2021-10-13 DIAGNOSIS — M11262 Other chondrocalcinosis, left knee: Secondary | ICD-10-CM | POA: Diagnosis not present

## 2021-10-13 DIAGNOSIS — M1712 Unilateral primary osteoarthritis, left knee: Secondary | ICD-10-CM | POA: Diagnosis not present

## 2021-10-13 DIAGNOSIS — Z1231 Encounter for screening mammogram for malignant neoplasm of breast: Secondary | ICD-10-CM | POA: Diagnosis not present

## 2021-10-13 DIAGNOSIS — M25462 Effusion, left knee: Secondary | ICD-10-CM | POA: Diagnosis not present

## 2021-10-26 DIAGNOSIS — N939 Abnormal uterine and vaginal bleeding, unspecified: Secondary | ICD-10-CM | POA: Diagnosis not present

## 2021-10-26 DIAGNOSIS — N898 Other specified noninflammatory disorders of vagina: Secondary | ICD-10-CM | POA: Diagnosis not present

## 2021-10-26 DIAGNOSIS — R300 Dysuria: Secondary | ICD-10-CM | POA: Diagnosis not present

## 2021-10-26 DIAGNOSIS — B372 Candidiasis of skin and nail: Secondary | ICD-10-CM | POA: Diagnosis not present

## 2021-10-26 DIAGNOSIS — I10 Essential (primary) hypertension: Secondary | ICD-10-CM | POA: Diagnosis not present

## 2021-10-26 DIAGNOSIS — E11649 Type 2 diabetes mellitus with hypoglycemia without coma: Secondary | ICD-10-CM | POA: Diagnosis not present

## 2021-11-03 DIAGNOSIS — M1712 Unilateral primary osteoarthritis, left knee: Secondary | ICD-10-CM | POA: Diagnosis not present

## 2021-11-03 DIAGNOSIS — M11262 Other chondrocalcinosis, left knee: Secondary | ICD-10-CM | POA: Diagnosis not present

## 2021-11-04 DIAGNOSIS — R928 Other abnormal and inconclusive findings on diagnostic imaging of breast: Secondary | ICD-10-CM | POA: Diagnosis not present

## 2021-11-04 DIAGNOSIS — R922 Inconclusive mammogram: Secondary | ICD-10-CM | POA: Diagnosis not present

## 2021-11-09 DIAGNOSIS — B372 Candidiasis of skin and nail: Secondary | ICD-10-CM | POA: Diagnosis not present

## 2021-11-09 DIAGNOSIS — I10 Essential (primary) hypertension: Secondary | ICD-10-CM | POA: Diagnosis not present

## 2021-11-09 DIAGNOSIS — E11649 Type 2 diabetes mellitus with hypoglycemia without coma: Secondary | ICD-10-CM | POA: Diagnosis not present

## 2021-11-09 DIAGNOSIS — N939 Abnormal uterine and vaginal bleeding, unspecified: Secondary | ICD-10-CM | POA: Diagnosis not present

## 2021-11-09 DIAGNOSIS — R002 Palpitations: Secondary | ICD-10-CM | POA: Diagnosis not present

## 2021-11-09 DIAGNOSIS — N898 Other specified noninflammatory disorders of vagina: Secondary | ICD-10-CM | POA: Diagnosis not present

## 2021-11-09 DIAGNOSIS — E890 Postprocedural hypothyroidism: Secondary | ICD-10-CM | POA: Diagnosis not present

## 2021-11-10 DIAGNOSIS — M25562 Pain in left knee: Secondary | ICD-10-CM | POA: Diagnosis not present

## 2021-11-10 DIAGNOSIS — S83272A Complex tear of lateral meniscus, current injury, left knee, initial encounter: Secondary | ICD-10-CM | POA: Diagnosis not present

## 2021-11-10 DIAGNOSIS — W010XXA Fall on same level from slipping, tripping and stumbling without subsequent striking against object, initial encounter: Secondary | ICD-10-CM | POA: Diagnosis not present

## 2021-11-10 DIAGNOSIS — S82142A Displaced bicondylar fracture of left tibia, initial encounter for closed fracture: Secondary | ICD-10-CM | POA: Diagnosis not present

## 2021-11-10 DIAGNOSIS — S83242A Other tear of medial meniscus, current injury, left knee, initial encounter: Secondary | ICD-10-CM | POA: Diagnosis not present

## 2021-11-12 DIAGNOSIS — H2513 Age-related nuclear cataract, bilateral: Secondary | ICD-10-CM | POA: Diagnosis not present

## 2021-11-12 DIAGNOSIS — H35373 Puckering of macula, bilateral: Secondary | ICD-10-CM | POA: Diagnosis not present

## 2021-11-12 DIAGNOSIS — S83249A Other tear of medial meniscus, current injury, unspecified knee, initial encounter: Secondary | ICD-10-CM | POA: Diagnosis not present

## 2021-11-12 DIAGNOSIS — M1712 Unilateral primary osteoarthritis, left knee: Secondary | ICD-10-CM | POA: Diagnosis not present

## 2021-11-12 DIAGNOSIS — M11262 Other chondrocalcinosis, left knee: Secondary | ICD-10-CM | POA: Diagnosis not present

## 2021-12-01 DIAGNOSIS — N6489 Other specified disorders of breast: Secondary | ICD-10-CM | POA: Diagnosis not present

## 2021-12-01 DIAGNOSIS — R921 Mammographic calcification found on diagnostic imaging of breast: Secondary | ICD-10-CM | POA: Diagnosis not present

## 2021-12-01 DIAGNOSIS — R920 Mammographic microcalcification found on diagnostic imaging of breast: Secondary | ICD-10-CM | POA: Diagnosis not present

## 2021-12-01 DIAGNOSIS — D242 Benign neoplasm of left breast: Secondary | ICD-10-CM | POA: Diagnosis not present

## 2021-12-01 DIAGNOSIS — R928 Other abnormal and inconclusive findings on diagnostic imaging of breast: Secondary | ICD-10-CM | POA: Diagnosis not present

## 2021-12-10 DIAGNOSIS — D242 Benign neoplasm of left breast: Secondary | ICD-10-CM | POA: Diagnosis not present

## 2021-12-14 DIAGNOSIS — S83249A Other tear of medial meniscus, current injury, unspecified knee, initial encounter: Secondary | ICD-10-CM | POA: Diagnosis not present

## 2021-12-14 DIAGNOSIS — M11262 Other chondrocalcinosis, left knee: Secondary | ICD-10-CM | POA: Diagnosis not present

## 2021-12-14 DIAGNOSIS — M1712 Unilateral primary osteoarthritis, left knee: Secondary | ICD-10-CM | POA: Diagnosis not present

## 2021-12-16 DIAGNOSIS — M8589 Other specified disorders of bone density and structure, multiple sites: Secondary | ICD-10-CM | POA: Diagnosis not present

## 2021-12-18 ENCOUNTER — Telehealth: Payer: Self-pay

## 2021-12-18 NOTE — Telephone Encounter (Signed)
Pt has an appt with Dr. Geraldo Pitter 8 am 12/23/21.  ?

## 2021-12-18 NOTE — Telephone Encounter (Signed)
? ?  Pre-operative Risk Assessment  ?  ?Patient Name: Cheryl Valenzuela  ?DOB: 1951-08-13 ?MRN: [id number]  ? ?  ? ?Request for Surgical Clearance   ? ?Procedure:   Left total knee arthroplasty ? ?Date of Surgery:  Clearance TBD                              ?   ?Surgeon:  Joya Salm, MD ?Surgeon's Group or Practice Name:  [facility] ?Phone number:  [phone]  ?Fax number:  [phone] ?  ?Type of Clearance Requested:   ?- Medical  ?  ?Type of Anesthesia:  General  ?  ?Additional requests/questions:  Please fax a copy of recent notes to the surgeon's office. ? ?Signed, ?Benjaman Pott Leeah Politano   ?12/18/2021, 12:59 PM  ? ?

## 2021-12-18 NOTE — Telephone Encounter (Signed)
? ?  Patient Name: Cheryl Valenzuela  ?DOB: May 03, 1951 ?MRN: [id number] ? ?Primary Cardiologist: Berniece Salines, DO ? ?Chart reviewed as part of pre-operative protocol coverage.  ? ?Preoperative team, please contact this patient and set up a phone call appointment for further cardiac evaluation.  ? ?Thank you for your help. ? ? ?Lenna Sciara, NP ?12/18/2021, 4:29 PM ? ? ?

## 2021-12-23 ENCOUNTER — Ambulatory Visit: Payer: PPO | Admitting: Cardiology

## 2021-12-23 ENCOUNTER — Encounter: Payer: Self-pay | Admitting: Cardiology

## 2021-12-23 VITALS — BP 170/66 | HR 81 | Ht 67.0 in | Wt 174.0 lb

## 2021-12-23 DIAGNOSIS — R7989 Other specified abnormal findings of blood chemistry: Secondary | ICD-10-CM

## 2021-12-23 DIAGNOSIS — E1165 Type 2 diabetes mellitus with hyperglycemia: Secondary | ICD-10-CM | POA: Diagnosis not present

## 2021-12-23 DIAGNOSIS — I2510 Atherosclerotic heart disease of native coronary artery without angina pectoris: Secondary | ICD-10-CM | POA: Diagnosis not present

## 2021-12-23 DIAGNOSIS — E785 Hyperlipidemia, unspecified: Secondary | ICD-10-CM

## 2021-12-23 DIAGNOSIS — I10 Essential (primary) hypertension: Secondary | ICD-10-CM | POA: Diagnosis not present

## 2021-12-23 DIAGNOSIS — Z01810 Encounter for preprocedural cardiovascular examination: Secondary | ICD-10-CM

## 2021-12-23 HISTORY — DX: Encounter for preprocedural cardiovascular examination: Z01.810

## 2021-12-23 NOTE — Patient Instructions (Signed)

## 2021-12-23 NOTE — Progress Notes (Signed)
?Cardiology Office Note:   ? ?Date:  12/23/2021  ? ?ID:  Treniyah Lynn, DOB 1950/11/13, MRN [id number] ? ?PCP:  Lowella Dandy, NP  ?Cardiologist:  Jenean Lindau, MD  ? ?Referring MD: Lowella Dandy, NP  ? ? ?ASSESSMENT:   ? ?1. Benign essential hypertension   ?2. Mild CAD   ?3. Type 2 diabetes mellitus with hyperglycemia, without long-term current use of insulin ([place])   ?4. Elevated liver function tests   ?5. Dyslipidemia   ?6. Preop cardiovascular exam   ? ?PLAN:   ? ?In order of problems listed above: ? ?Preoperative stratification from a cardiovascular standpoint: I discussed my findings with the patient at length.  She has mild coronary artery disease by coronary angiography done last year.  In view of this she is not at high risk for coronary events during the aforementioned surgery.  Meticulous hemodynamic monitoring will further reduce the risk of coronary events. ?Essential hypertension: Patient has blood pressure readings brought from home and they are excellent.  She has an element of whitecoat hypertension.  She is very worried about the situation and pain in her knee and wants to get surgery done as soon as possible.  I think she is not at high risk based on above evaluation. ?Mixed dyslipidemia: On lipid-lowering therapy and lipids were reviewed from [facility] sheet and found to be fine. ?Patient will be seen in follow-up appointment in 9 months or earlier if the patient has any concerns ? ? ? ?Medication Adjustments/Labs and Tests Ordered: ?Current medicines are reviewed at length with the patient today.  Concerns regarding medicines are outlined above.  ?No orders of the defined types were placed in this encounter. ? ?No orders of the defined types were placed in this encounter. ? ? ? ?Chief Complaint  ?Patient presents with  ? Pre-op Exam  ?  ? ?History of Present Illness:   ? ?Edith Groleau is a 71 y.o. female.  Patient has past medical history of mild coronary artery disease by CT coronary angiography done last  year.  She has history of essential hypertension, whitecoat hypertension and mixed dyslipidemia.  She denies any problems at this time and takes care of activities of daily living.  She is very anxious and worried because she has pain in the left knee and planning to undergo surgery.  At the time of my evaluation, the patient is alert awake oriented and in no distress. ? ?Past Medical History:  ?Diagnosis Date  ? Acute conjunctivitis of right eye   ? Adult-onset obesity   ? Allergic contact dermatitis due to plant   ? Anti-hypertensive induced fatigue   ? Benign essential hypertension   ? Cellulitis of umbilicus   ? Cyst, Baker's knee   ? DIABETES MELLITUS, TYPE II 06/25/2007  ? Qualifier: Diagnosis of  By: Loanne Drilling MD, Jacelyn Pi   ? Dyslipidemia   ? Elevated liver function tests   ? Eustachian catarrh   ? HYPERTENSION 06/25/2007  ? Qualifier: Diagnosis of  By: Loanne Drilling MD, Jacelyn Pi   ? Hypertension 02/17/2021  ? Injury of left lower extremity   ? Insomnia, unspecified   ? Lymphadenitis   ? Menopausal disorder   ? Menopause   ? Mild CAD 02/17/2021  ? Nonalcoholic fatty liver disease   ? Osteoarthritis of right knee   ? Pain in joint, multiple sites   ? Palpitations   ? Pharyngitis   ? Post-operative hypothyroidism   ? Postsurgical hypothyroidism 08/02/2007  ?  Qualifier: Diagnosis of  By: Loanne Drilling MD, Jacelyn Pi   ? Pre-ulcerative corn or callous   ? Seasonal allergies   ? Sinusitis   ? Type 2 diabetes mellitus with complication, without long-term current use of insulin ([place]) 02/17/2021  ? Type 2 diabetes mellitus with hyperglycemia, without long-term current use of insulin ([place])   ? Unspecified hypothyroidism 06/23/2007  ? Centricity Description: HYPOTHYROIDISM NOS Qualifier: Diagnosis of  By: Loanne Drilling MD, Jacelyn Pi  Centricity Description: HYPOTHYROIDISM Qualifier: Diagnosis of  By: Loanne Drilling MD, Jacelyn Pi   ? Varicose veins without complication   ? Vitamin D deficiency   ? ? ?Past Surgical History:  ?Procedure Laterality Date  ? ABDOMINAL  HYSTERECTOMY  1990  ? CHOLECYSTECTOMY  2005  ? DILATION AND CURETTAGE OF UTERUS    ? multiple  ? THYROIDECTOMY  2008  ? ? ?Current Medications: ?Current Meds  ?Medication Sig  ? aspirin EC 81 MG tablet Take 81 mg by mouth daily. Swallow whole.  ? atorvastatin (LIPITOR) 10 MG tablet Take 10 mg by mouth daily.  ? Calcium Carb-Cholecalciferol (CALCIUM-VITAMIN D3) 600-200 MG-UNIT TABS Take 1 tablet by mouth 2 (two) times daily.  ? Cholecalciferol (VITAMIN D3) 125 MCG (5000 UT) TABS Take 5,000 Units by mouth daily.  ? diltiazem (CARDIZEM CD) 120 MG 24 hr capsule Take 1 capsule (120 mg total) by mouth daily.  ? fluticasone (FLONASE) 50 MCG/ACT nasal spray Place 2 sprays into both nostrils daily.  ? Krill Oil 500 MG CAPS Take 500 mg by mouth daily.  ? levothyroxine (SYNTHROID) 137 MCG tablet Take 137 mcg by mouth daily.  ? losartan-hydrochlorothiazide (HYZAAR) 50-12.5 MG tablet Take 1 tablet by mouth daily.  ? metFORMIN (GLUCOPHAGE-XR) 750 MG 24 hr tablet Take 750 mg by mouth 2 (two) times daily with a meal.  ? montelukast (SINGULAIR) 10 MG tablet Take 10 mg by mouth at bedtime as needed for allergies.  ? Multiple Vitamins-Minerals (CENTRUM SILVER PO) Take 1 tablet by mouth daily.  ? ondansetron (ZOFRAN-ODT) 4 MG disintegrating tablet Take 4 mg by mouth every 8 (eight) hours as needed for nausea or vomiting.  ?  ? ?Allergies:   Codeine, Lisinopril, Plendil [felodipine], and Sulfa antibiotics  ? ?Social History  ? ?Socioeconomic History  ? Marital status: Married  ?  Spouse name: Not on file  ? Number of children: Not on file  ? Years of education: Not on file  ? Highest education level: Not on file  ?Occupational History  ? Not on file  ?Tobacco Use  ? Smoking status: Never  ? Smokeless tobacco: Never  ?Substance and Sexual Activity  ? Alcohol use: Never  ? Drug use: Never  ? Sexual activity: Not on file  ?Other Topics Concern  ? Not on file  ?Social History Narrative  ? Not on file  ? ?Social Determinants of Health   ? ?Financial Resource Strain: Not on file  ?Food Insecurity: Not on file  ?Transportation Needs: Not on file  ?Physical Activity: Not on file  ?Stress: Not on file  ?Social Connections: Not on file  ?  ? ?Family History: ?The patient's family history includes Arthritis in her mother and sister; Breast cancer in her mother; Diabetes in her brother, father, mother, and sister; Heart attack in her father; Heart disease in her father; Hypertension in her brother, mother, and sister; Leukemia in her brother; Thyroid disease in her mother. ? ?ROS:   ?Please see the history of present illness.    ?All other  systems reviewed and are negative. ? ?EKGs/Labs/Other Studies Reviewed:   ? ?The following studies were reviewed today: ?EKG reveals sinus rhythm and nonspecific ST-T changes ? ? ?Recent Labs: ?01/12/2021: BUN 12; Creatinine, Ser 0.55; Magnesium 1.9; Potassium 4.2; Sodium 135  ?Recent Lipid Panel ?No results found for: CHOL, TRIG, HDL, CHOLHDL, VLDL, LDLCALC, LDLDIRECT ? ?Physical Exam:   ? ?VS:  BP (!) 170/66 (BP Location: Right Arm, Patient Position: Sitting, Cuff Size: Normal)   Pulse 81   Ht '5\' 7"'$  (1.702 m)   Wt 174 lb (78.9 kg)   SpO2 96%   BMI 27.25 kg/m?    ? ?Wt Readings from Last 3 Encounters:  ?12/23/21 174 lb (78.9 kg)  ?05/15/21 176 lb (79.8 kg)  ?02/17/21 182 lb (82.6 kg)  ?  ? ?GEN: Patient is in no acute distress ?HEENT: Normal ?NECK: No JVD; No carotid bruits ?LYMPHATICS: No lymphadenopathy ?CARDIAC: Hear sounds regular, 2/6 systolic murmur at the apex. ?RESPIRATORY:  Clear to auscultation without rales, wheezing or rhonchi  ?ABDOMEN: Soft, non-tender, non-distended ?MUSCULOSKELETAL:  No edema; No deformity  ?SKIN: Warm and dry ?NEUROLOGIC:  Alert and oriented x 3 ?PSYCHIATRIC:  Normal affect  ? ?Signed, ?Jenean Lindau, MD  ?12/23/2021 8:32 AM    ?[facility]  ?

## 2021-12-29 ENCOUNTER — Other Ambulatory Visit: Payer: Self-pay | Admitting: Cardiology

## 2021-12-31 ENCOUNTER — Telehealth: Payer: Self-pay | Admitting: Cardiology

## 2021-12-31 DIAGNOSIS — M79609 Pain in unspecified limb: Secondary | ICD-10-CM | POA: Diagnosis not present

## 2021-12-31 DIAGNOSIS — Z79899 Other long term (current) drug therapy: Secondary | ICD-10-CM | POA: Diagnosis not present

## 2021-12-31 DIAGNOSIS — E559 Vitamin D deficiency, unspecified: Secondary | ICD-10-CM | POA: Diagnosis not present

## 2021-12-31 DIAGNOSIS — Z01818 Encounter for other preprocedural examination: Secondary | ICD-10-CM | POA: Diagnosis not present

## 2021-12-31 NOTE — Telephone Encounter (Signed)
Needs EKG from 4/12 fax: [phone]. Patient is there now bout to have procedure but the need to see the EKG first. Please advise  ?

## 2022-01-04 DIAGNOSIS — R002 Palpitations: Secondary | ICD-10-CM | POA: Diagnosis not present

## 2022-01-04 DIAGNOSIS — M1712 Unilateral primary osteoarthritis, left knee: Secondary | ICD-10-CM | POA: Diagnosis not present

## 2022-01-04 DIAGNOSIS — E890 Postprocedural hypothyroidism: Secondary | ICD-10-CM | POA: Diagnosis not present

## 2022-01-04 DIAGNOSIS — B372 Candidiasis of skin and nail: Secondary | ICD-10-CM | POA: Diagnosis not present

## 2022-01-04 DIAGNOSIS — F4322 Adjustment disorder with anxiety: Secondary | ICD-10-CM | POA: Diagnosis not present

## 2022-01-04 DIAGNOSIS — R21 Rash and other nonspecific skin eruption: Secondary | ICD-10-CM | POA: Diagnosis not present

## 2022-01-04 DIAGNOSIS — R7989 Other specified abnormal findings of blood chemistry: Secondary | ICD-10-CM | POA: Diagnosis not present

## 2022-01-04 DIAGNOSIS — E1165 Type 2 diabetes mellitus with hyperglycemia: Secondary | ICD-10-CM | POA: Diagnosis not present

## 2022-01-04 DIAGNOSIS — E559 Vitamin D deficiency, unspecified: Secondary | ICD-10-CM | POA: Diagnosis not present

## 2022-01-04 DIAGNOSIS — G4700 Insomnia, unspecified: Secondary | ICD-10-CM | POA: Diagnosis not present

## 2022-01-04 DIAGNOSIS — E785 Hyperlipidemia, unspecified: Secondary | ICD-10-CM | POA: Diagnosis not present

## 2022-01-21 DIAGNOSIS — Z471 Aftercare following joint replacement surgery: Secondary | ICD-10-CM | POA: Diagnosis not present

## 2022-01-21 DIAGNOSIS — Z9889 Other specified postprocedural states: Secondary | ICD-10-CM | POA: Diagnosis not present

## 2022-01-21 DIAGNOSIS — Z7984 Long term (current) use of oral hypoglycemic drugs: Secondary | ICD-10-CM | POA: Diagnosis not present

## 2022-01-21 DIAGNOSIS — Z96642 Presence of left artificial hip joint: Secondary | ICD-10-CM | POA: Diagnosis not present

## 2022-01-21 DIAGNOSIS — E119 Type 2 diabetes mellitus without complications: Secondary | ICD-10-CM | POA: Diagnosis not present

## 2022-01-21 DIAGNOSIS — G8918 Other acute postprocedural pain: Secondary | ICD-10-CM | POA: Diagnosis not present

## 2022-01-21 DIAGNOSIS — M1712 Unilateral primary osteoarthritis, left knee: Secondary | ICD-10-CM | POA: Diagnosis not present

## 2022-01-21 DIAGNOSIS — R600 Localized edema: Secondary | ICD-10-CM | POA: Diagnosis not present

## 2022-01-23 DIAGNOSIS — Z7984 Long term (current) use of oral hypoglycemic drugs: Secondary | ICD-10-CM | POA: Diagnosis not present

## 2022-01-23 DIAGNOSIS — Z791 Long term (current) use of non-steroidal anti-inflammatories (NSAID): Secondary | ICD-10-CM | POA: Diagnosis not present

## 2022-01-23 DIAGNOSIS — E559 Vitamin D deficiency, unspecified: Secondary | ICD-10-CM | POA: Diagnosis not present

## 2022-01-23 DIAGNOSIS — S83249D Other tear of medial meniscus, current injury, unspecified knee, subsequent encounter: Secondary | ICD-10-CM | POA: Diagnosis not present

## 2022-01-23 DIAGNOSIS — Z471 Aftercare following joint replacement surgery: Secondary | ICD-10-CM | POA: Diagnosis not present

## 2022-01-23 DIAGNOSIS — M7120 Synovial cyst of popliteal space [Baker], unspecified knee: Secondary | ICD-10-CM | POA: Diagnosis not present

## 2022-01-23 DIAGNOSIS — Z79891 Long term (current) use of opiate analgesic: Secondary | ICD-10-CM | POA: Diagnosis not present

## 2022-01-23 DIAGNOSIS — E039 Hypothyroidism, unspecified: Secondary | ICD-10-CM | POA: Diagnosis not present

## 2022-01-23 DIAGNOSIS — M1711 Unilateral primary osteoarthritis, right knee: Secondary | ICD-10-CM | POA: Diagnosis not present

## 2022-01-23 DIAGNOSIS — K760 Fatty (change of) liver, not elsewhere classified: Secondary | ICD-10-CM | POA: Diagnosis not present

## 2022-01-23 DIAGNOSIS — E079 Disorder of thyroid, unspecified: Secondary | ICD-10-CM | POA: Diagnosis not present

## 2022-01-23 DIAGNOSIS — D242 Benign neoplasm of left breast: Secondary | ICD-10-CM | POA: Diagnosis not present

## 2022-01-23 DIAGNOSIS — E785 Hyperlipidemia, unspecified: Secondary | ICD-10-CM | POA: Diagnosis not present

## 2022-01-23 DIAGNOSIS — Z96652 Presence of left artificial knee joint: Secondary | ICD-10-CM | POA: Diagnosis not present

## 2022-01-23 DIAGNOSIS — E119 Type 2 diabetes mellitus without complications: Secondary | ICD-10-CM | POA: Diagnosis not present

## 2022-01-23 DIAGNOSIS — M4800 Spinal stenosis, site unspecified: Secondary | ICD-10-CM | POA: Diagnosis not present

## 2022-01-23 DIAGNOSIS — Z7982 Long term (current) use of aspirin: Secondary | ICD-10-CM | POA: Diagnosis not present

## 2022-01-23 DIAGNOSIS — I119 Hypertensive heart disease without heart failure: Secondary | ICD-10-CM | POA: Diagnosis not present

## 2022-02-04 DIAGNOSIS — M25662 Stiffness of left knee, not elsewhere classified: Secondary | ICD-10-CM | POA: Diagnosis not present

## 2022-02-04 DIAGNOSIS — R2689 Other abnormalities of gait and mobility: Secondary | ICD-10-CM | POA: Diagnosis not present

## 2022-02-04 DIAGNOSIS — M25562 Pain in left knee: Secondary | ICD-10-CM | POA: Diagnosis not present

## 2022-02-04 DIAGNOSIS — M6281 Muscle weakness (generalized): Secondary | ICD-10-CM | POA: Diagnosis not present

## 2022-02-10 DIAGNOSIS — M6281 Muscle weakness (generalized): Secondary | ICD-10-CM | POA: Diagnosis not present

## 2022-02-10 DIAGNOSIS — R2689 Other abnormalities of gait and mobility: Secondary | ICD-10-CM | POA: Diagnosis not present

## 2022-02-10 DIAGNOSIS — M25662 Stiffness of left knee, not elsewhere classified: Secondary | ICD-10-CM | POA: Diagnosis not present

## 2022-02-10 DIAGNOSIS — M25562 Pain in left knee: Secondary | ICD-10-CM | POA: Diagnosis not present

## 2022-02-12 DIAGNOSIS — M6281 Muscle weakness (generalized): Secondary | ICD-10-CM | POA: Diagnosis not present

## 2022-02-12 DIAGNOSIS — M25562 Pain in left knee: Secondary | ICD-10-CM | POA: Diagnosis not present

## 2022-02-12 DIAGNOSIS — M25662 Stiffness of left knee, not elsewhere classified: Secondary | ICD-10-CM | POA: Diagnosis not present

## 2022-02-12 DIAGNOSIS — R2689 Other abnormalities of gait and mobility: Secondary | ICD-10-CM | POA: Diagnosis not present

## 2022-02-15 DIAGNOSIS — M25662 Stiffness of left knee, not elsewhere classified: Secondary | ICD-10-CM | POA: Diagnosis not present

## 2022-02-15 DIAGNOSIS — R2689 Other abnormalities of gait and mobility: Secondary | ICD-10-CM | POA: Diagnosis not present

## 2022-02-15 DIAGNOSIS — M6281 Muscle weakness (generalized): Secondary | ICD-10-CM | POA: Diagnosis not present

## 2022-02-15 DIAGNOSIS — M25562 Pain in left knee: Secondary | ICD-10-CM | POA: Diagnosis not present

## 2022-02-17 DIAGNOSIS — M25662 Stiffness of left knee, not elsewhere classified: Secondary | ICD-10-CM | POA: Diagnosis not present

## 2022-02-17 DIAGNOSIS — R2689 Other abnormalities of gait and mobility: Secondary | ICD-10-CM | POA: Diagnosis not present

## 2022-02-17 DIAGNOSIS — M6281 Muscle weakness (generalized): Secondary | ICD-10-CM | POA: Diagnosis not present

## 2022-02-17 DIAGNOSIS — M25562 Pain in left knee: Secondary | ICD-10-CM | POA: Diagnosis not present

## 2022-02-22 DIAGNOSIS — M6281 Muscle weakness (generalized): Secondary | ICD-10-CM | POA: Diagnosis not present

## 2022-02-22 DIAGNOSIS — M25562 Pain in left knee: Secondary | ICD-10-CM | POA: Diagnosis not present

## 2022-02-22 DIAGNOSIS — M25662 Stiffness of left knee, not elsewhere classified: Secondary | ICD-10-CM | POA: Diagnosis not present

## 2022-02-22 DIAGNOSIS — R2689 Other abnormalities of gait and mobility: Secondary | ICD-10-CM | POA: Diagnosis not present

## 2022-02-26 DIAGNOSIS — M6281 Muscle weakness (generalized): Secondary | ICD-10-CM | POA: Diagnosis not present

## 2022-02-26 DIAGNOSIS — M25562 Pain in left knee: Secondary | ICD-10-CM | POA: Diagnosis not present

## 2022-02-26 DIAGNOSIS — M25662 Stiffness of left knee, not elsewhere classified: Secondary | ICD-10-CM | POA: Diagnosis not present

## 2022-02-26 DIAGNOSIS — R2689 Other abnormalities of gait and mobility: Secondary | ICD-10-CM | POA: Diagnosis not present

## 2022-03-01 DIAGNOSIS — R2689 Other abnormalities of gait and mobility: Secondary | ICD-10-CM | POA: Diagnosis not present

## 2022-03-01 DIAGNOSIS — M25662 Stiffness of left knee, not elsewhere classified: Secondary | ICD-10-CM | POA: Diagnosis not present

## 2022-03-01 DIAGNOSIS — M25562 Pain in left knee: Secondary | ICD-10-CM | POA: Diagnosis not present

## 2022-03-01 DIAGNOSIS — M6281 Muscle weakness (generalized): Secondary | ICD-10-CM | POA: Diagnosis not present

## 2022-03-03 DIAGNOSIS — M1712 Unilateral primary osteoarthritis, left knee: Secondary | ICD-10-CM | POA: Diagnosis not present

## 2022-03-04 DIAGNOSIS — M6281 Muscle weakness (generalized): Secondary | ICD-10-CM | POA: Diagnosis not present

## 2022-03-04 DIAGNOSIS — R2689 Other abnormalities of gait and mobility: Secondary | ICD-10-CM | POA: Diagnosis not present

## 2022-03-04 DIAGNOSIS — M25562 Pain in left knee: Secondary | ICD-10-CM | POA: Diagnosis not present

## 2022-03-04 DIAGNOSIS — M25662 Stiffness of left knee, not elsewhere classified: Secondary | ICD-10-CM | POA: Diagnosis not present

## 2022-03-08 DIAGNOSIS — M6281 Muscle weakness (generalized): Secondary | ICD-10-CM | POA: Diagnosis not present

## 2022-03-08 DIAGNOSIS — M25662 Stiffness of left knee, not elsewhere classified: Secondary | ICD-10-CM | POA: Diagnosis not present

## 2022-03-08 DIAGNOSIS — R2689 Other abnormalities of gait and mobility: Secondary | ICD-10-CM | POA: Diagnosis not present

## 2022-03-08 DIAGNOSIS — M25562 Pain in left knee: Secondary | ICD-10-CM | POA: Diagnosis not present

## 2022-03-10 DIAGNOSIS — R2689 Other abnormalities of gait and mobility: Secondary | ICD-10-CM | POA: Diagnosis not present

## 2022-03-10 DIAGNOSIS — M6281 Muscle weakness (generalized): Secondary | ICD-10-CM | POA: Diagnosis not present

## 2022-03-10 DIAGNOSIS — M25662 Stiffness of left knee, not elsewhere classified: Secondary | ICD-10-CM | POA: Diagnosis not present

## 2022-03-10 DIAGNOSIS — M25562 Pain in left knee: Secondary | ICD-10-CM | POA: Diagnosis not present

## 2022-03-15 DIAGNOSIS — M25562 Pain in left knee: Secondary | ICD-10-CM | POA: Diagnosis not present

## 2022-03-15 DIAGNOSIS — M25662 Stiffness of left knee, not elsewhere classified: Secondary | ICD-10-CM | POA: Diagnosis not present

## 2022-03-15 DIAGNOSIS — R2689 Other abnormalities of gait and mobility: Secondary | ICD-10-CM | POA: Diagnosis not present

## 2022-03-15 DIAGNOSIS — M6281 Muscle weakness (generalized): Secondary | ICD-10-CM | POA: Diagnosis not present

## 2022-03-17 DIAGNOSIS — M25562 Pain in left knee: Secondary | ICD-10-CM | POA: Diagnosis not present

## 2022-03-17 DIAGNOSIS — M6281 Muscle weakness (generalized): Secondary | ICD-10-CM | POA: Diagnosis not present

## 2022-03-17 DIAGNOSIS — M25662 Stiffness of left knee, not elsewhere classified: Secondary | ICD-10-CM | POA: Diagnosis not present

## 2022-03-17 DIAGNOSIS — R2689 Other abnormalities of gait and mobility: Secondary | ICD-10-CM | POA: Diagnosis not present

## 2022-03-22 DIAGNOSIS — R2689 Other abnormalities of gait and mobility: Secondary | ICD-10-CM | POA: Diagnosis not present

## 2022-03-22 DIAGNOSIS — M25662 Stiffness of left knee, not elsewhere classified: Secondary | ICD-10-CM | POA: Diagnosis not present

## 2022-03-22 DIAGNOSIS — M6281 Muscle weakness (generalized): Secondary | ICD-10-CM | POA: Diagnosis not present

## 2022-03-22 DIAGNOSIS — M25562 Pain in left knee: Secondary | ICD-10-CM | POA: Diagnosis not present

## 2022-03-24 DIAGNOSIS — M25662 Stiffness of left knee, not elsewhere classified: Secondary | ICD-10-CM | POA: Diagnosis not present

## 2022-03-24 DIAGNOSIS — M25562 Pain in left knee: Secondary | ICD-10-CM | POA: Diagnosis not present

## 2022-03-24 DIAGNOSIS — M6281 Muscle weakness (generalized): Secondary | ICD-10-CM | POA: Diagnosis not present

## 2022-03-24 DIAGNOSIS — R2689 Other abnormalities of gait and mobility: Secondary | ICD-10-CM | POA: Diagnosis not present

## 2022-03-29 DIAGNOSIS — M25662 Stiffness of left knee, not elsewhere classified: Secondary | ICD-10-CM | POA: Diagnosis not present

## 2022-03-29 DIAGNOSIS — R2689 Other abnormalities of gait and mobility: Secondary | ICD-10-CM | POA: Diagnosis not present

## 2022-03-29 DIAGNOSIS — M25562 Pain in left knee: Secondary | ICD-10-CM | POA: Diagnosis not present

## 2022-03-29 DIAGNOSIS — M6281 Muscle weakness (generalized): Secondary | ICD-10-CM | POA: Diagnosis not present

## 2022-03-31 DIAGNOSIS — M25662 Stiffness of left knee, not elsewhere classified: Secondary | ICD-10-CM | POA: Diagnosis not present

## 2022-03-31 DIAGNOSIS — M6281 Muscle weakness (generalized): Secondary | ICD-10-CM | POA: Diagnosis not present

## 2022-03-31 DIAGNOSIS — R2689 Other abnormalities of gait and mobility: Secondary | ICD-10-CM | POA: Diagnosis not present

## 2022-03-31 DIAGNOSIS — M25562 Pain in left knee: Secondary | ICD-10-CM | POA: Diagnosis not present

## 2022-05-05 DIAGNOSIS — E119 Type 2 diabetes mellitus without complications: Secondary | ICD-10-CM | POA: Diagnosis not present

## 2022-05-05 DIAGNOSIS — H25813 Combined forms of age-related cataract, bilateral: Secondary | ICD-10-CM | POA: Diagnosis not present

## 2022-05-05 DIAGNOSIS — R928 Other abnormal and inconclusive findings on diagnostic imaging of breast: Secondary | ICD-10-CM | POA: Diagnosis not present

## 2022-05-05 DIAGNOSIS — N6320 Unspecified lump in the left breast, unspecified quadrant: Secondary | ICD-10-CM | POA: Diagnosis not present

## 2022-05-05 DIAGNOSIS — H524 Presbyopia: Secondary | ICD-10-CM | POA: Diagnosis not present

## 2022-05-10 DIAGNOSIS — R7989 Other specified abnormal findings of blood chemistry: Secondary | ICD-10-CM | POA: Diagnosis not present

## 2022-05-10 DIAGNOSIS — E559 Vitamin D deficiency, unspecified: Secondary | ICD-10-CM | POA: Diagnosis not present

## 2022-05-10 DIAGNOSIS — Z139 Encounter for screening, unspecified: Secondary | ICD-10-CM | POA: Diagnosis not present

## 2022-05-10 DIAGNOSIS — R002 Palpitations: Secondary | ICD-10-CM | POA: Diagnosis not present

## 2022-05-10 DIAGNOSIS — E1165 Type 2 diabetes mellitus with hyperglycemia: Secondary | ICD-10-CM | POA: Diagnosis not present

## 2022-05-10 DIAGNOSIS — E890 Postprocedural hypothyroidism: Secondary | ICD-10-CM | POA: Diagnosis not present

## 2022-05-10 DIAGNOSIS — G4700 Insomnia, unspecified: Secondary | ICD-10-CM | POA: Diagnosis not present

## 2022-05-10 DIAGNOSIS — M1712 Unilateral primary osteoarthritis, left knee: Secondary | ICD-10-CM | POA: Diagnosis not present

## 2022-05-10 DIAGNOSIS — Z9181 History of falling: Secondary | ICD-10-CM | POA: Diagnosis not present

## 2022-05-10 DIAGNOSIS — E785 Hyperlipidemia, unspecified: Secondary | ICD-10-CM | POA: Diagnosis not present

## 2022-05-10 DIAGNOSIS — F4322 Adjustment disorder with anxiety: Secondary | ICD-10-CM | POA: Diagnosis not present

## 2022-05-10 DIAGNOSIS — Z1331 Encounter for screening for depression: Secondary | ICD-10-CM | POA: Diagnosis not present

## 2022-05-12 DIAGNOSIS — D242 Benign neoplasm of left breast: Secondary | ICD-10-CM | POA: Diagnosis not present

## 2022-05-13 IMAGING — CT CT HEART MORP W/ CTA COR W/ SCORE W/ CA W/CM &/OR W/O CM
4 of 7 series · 8 of 20 positions shown, 9 images · IV contrast (APPLIED)
Comparison: None
COMPARISON: None

Addendum:
EXAM:
OVER-READ INTERPRETATION  CT CHEST

The following report is an over-read performed by radiologist Dr.
over-read does not include interpretation of cardiac or coronary
anatomy or pathology. The coronary calcium score/coronary CTA
interpretation by the cardiologist is attached.
CLINICAL DATA: This is a 70 year old female with chest pain.
Cardiac/Coronary  CTA
TECHNIQUE: The patient was scanned on a [employer] Force scanner.

[Series 6: best diast 75 % · axial · 0.44mm/px · z∈[+1068,+1108]mm · 2 of 301 slices shown, 3 images]
[im 101/301  vessel]
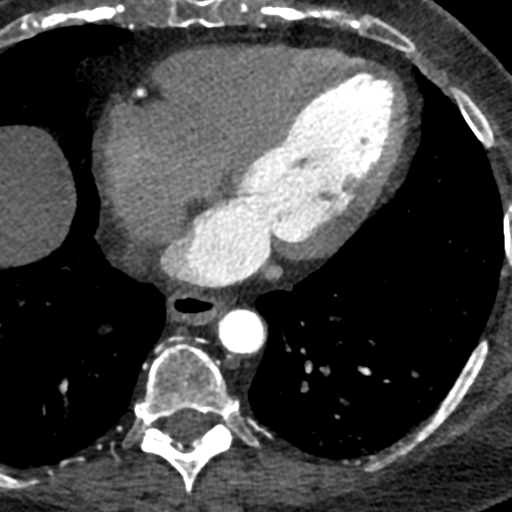
[im 101/301  lung]
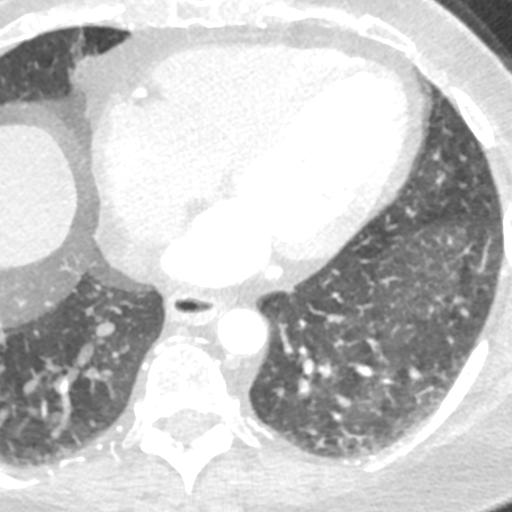
[im 201/301  vessel]
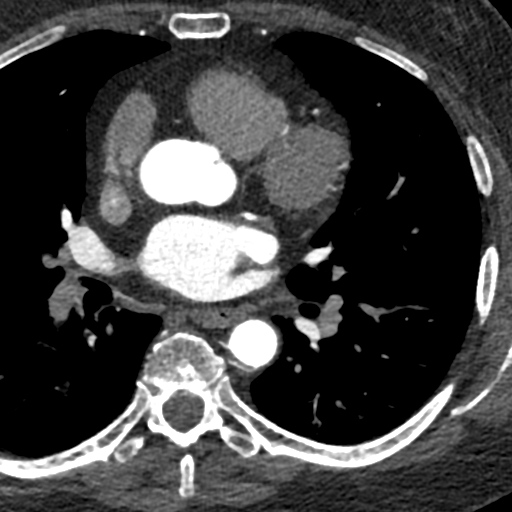

[Series 7: best syst 32 % · axial · 0.44mm/px · z∈[+1068,+1108]mm · 2 of 301 slices shown]
[im 101/301  vessel]
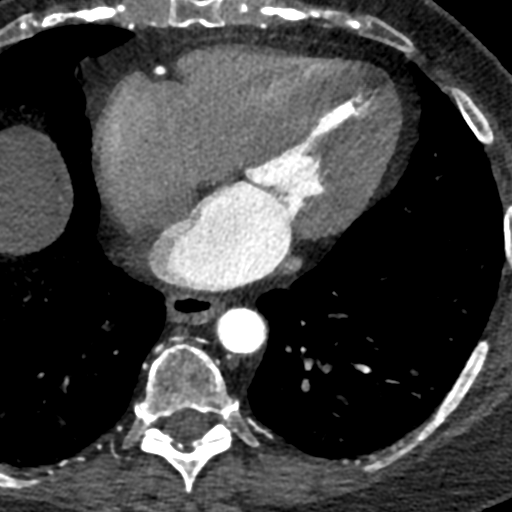
[im 201/301  vessel]
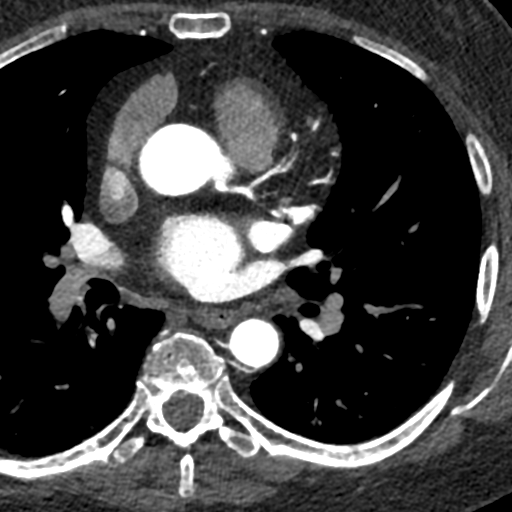

[Series 8: ts diast sharp 75 % · axial · 0.44mm/px · z∈[+1068,+1108]mm · 2 of 301 slices shown]
[im 101/301  lung]
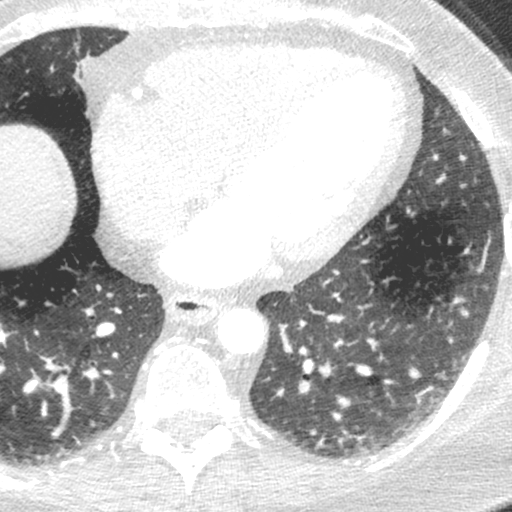
[im 201/301  lung]
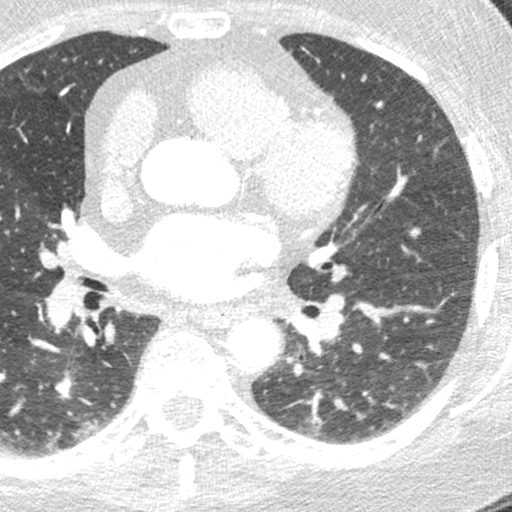

[Series 9: ts syst sharp 32 % · axial · 0.44mm/px · z∈[+1068,+1108]mm · 2 of 301 slices shown]
[im 101/301  lung]
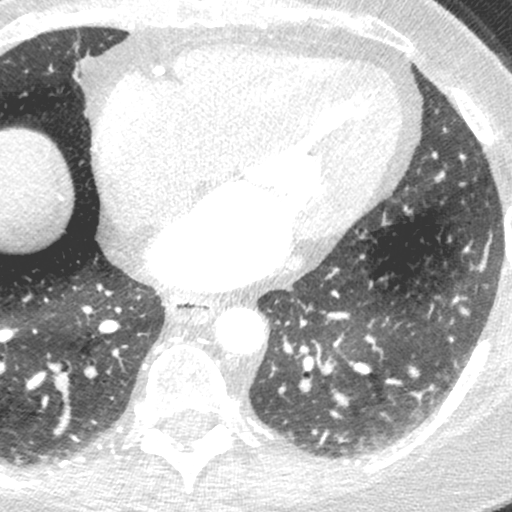
[im 201/301  lung]
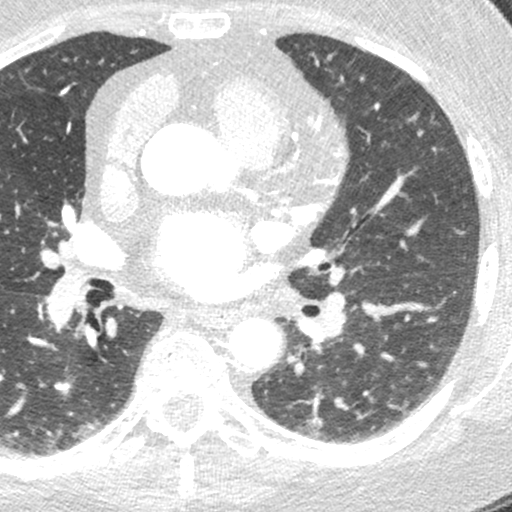

[8 of 20 positions shown; findings below may reference images not displayed]

FINDINGS: Vascular: See dedicated report for details regarding coronary artery
calcium and coronary CT angiography. Aortic caliber is normal with
aortic atherosclerosis of the thoracic aortic arch, mild.

Mediastinum/Nodes: Visualized mediastinal structures are
unremarkable. No signs of adenopathy in the chest. Chest is
incompletely imaged, only through cardiac structures in the mid
chest. No gross hilar adenopathy.

Lungs/Pleura: Mild basilar atelectasis. No suspicious nodule. No
consolidative changes. Airways, visible airways are patent, chest
incompletely imaged. Motion artifact limits assessment.

Upper Abdomen: Hepatic steatosis is suspected, limited assessment of
upper abdominal contents without acute process.

Musculoskeletal: Spinal degenerative changes. No acute or
destructive bone process.
IMPRESSION: Aortic atherosclerosis.

Hepatic steatosis.
FINDINGS: A 100 kV prospective scan was triggered in the descending thoracic
aorta at 111 HU's. Axial non-contrast 3 mm slices were carried out
through the heart. The data set was analyzed on a dedicated work
station and scored using the Agatson method. Gantry rotation speed
was 250 msecs and collimation was .6 mm. No beta blockade and 0.8 mg
of sl NTG was given. The 3D data set was reconstructed in 5%
intervals of the 67-82 % of the R-R cycle. Diastolic phases were
analyzed on a dedicated work station using MPR, MIP and VRT modes.
The patient received 80 cc of contrast.

Aorta: Normal size. Mild aortic root calcifications. No dissection.

Aortic Valve:  Trileaflet.  No calcifications.

Coronary Arteries:  Normal coronary origin.  Right dominance.

RCA is a large dominant artery that gives rise to PDA and PLA. There
is minimal (<24%) soft plaque in the mid RCA. The distal RCA has a
mild (25-49%) calcified plaque. The proximal RCA with no plaques.

Left main is a large artery that gives rise to LAD, Ramus
Intermedius and LCX arteries.

LAD is a large vessel that has no plaque.

Ramus with no plaques.

LCX is a non-dominant artery that gives rise to one large OM1
branch. There is no plaque.

Other findings:

5 pulmonary veins including Right middle pulmonary vein (normal
variant) with drainage into the left atrium. No evidence of
pulmonary vein stenosis.

Normal left atrial appendage without a thrombus.

Normal size of the pulmonary artery.
IMPRESSION: 1. Coronary calcium score of 31.2. This was 55 percentile for age
and sex matched control.

2. Normal coronary origin with right dominance.

3. Mild coronary artery disease. CAD-RADS 2. Mild non-obstructive
CAD (25-49%). Consider non-atherosclerotic causes of chest pain.
Consider preventive therapy and risk factor modification.

*** End of Addendum ***
EXAM:
OVER-READ INTERPRETATION  CT CHEST

The following report is an over-read performed by radiologist Dr.
over-read does not include interpretation of cardiac or coronary
anatomy or pathology. The coronary calcium score/coronary CTA
interpretation by the cardiologist is attached.
FINDINGS: Vascular: See dedicated report for details regarding coronary artery
calcium and coronary CT angiography. Aortic caliber is normal with
aortic atherosclerosis of the thoracic aortic arch, mild.

Mediastinum/Nodes: Visualized mediastinal structures are
unremarkable. No signs of adenopathy in the chest. Chest is
incompletely imaged, only through cardiac structures in the mid
chest. No gross hilar adenopathy.

Lungs/Pleura: Mild basilar atelectasis. No suspicious nodule. No
consolidative changes. Airways, visible airways are patent, chest
incompletely imaged. Motion artifact limits assessment.

Upper Abdomen: Hepatic steatosis is suspected, limited assessment of
upper abdominal contents without acute process.

Musculoskeletal: Spinal degenerative changes. No acute or
destructive bone process.
IMPRESSION: Aortic atherosclerosis.

Hepatic steatosis.

## 2022-05-26 DIAGNOSIS — Z9181 History of falling: Secondary | ICD-10-CM | POA: Diagnosis not present

## 2022-05-26 DIAGNOSIS — E785 Hyperlipidemia, unspecified: Secondary | ICD-10-CM | POA: Diagnosis not present

## 2022-05-26 DIAGNOSIS — Z1331 Encounter for screening for depression: Secondary | ICD-10-CM | POA: Diagnosis not present

## 2022-05-26 DIAGNOSIS — Z0000 Encounter for general adult medical examination without abnormal findings: Secondary | ICD-10-CM | POA: Diagnosis not present

## 2022-07-19 DIAGNOSIS — M1712 Unilateral primary osteoarthritis, left knee: Secondary | ICD-10-CM | POA: Diagnosis not present

## 2022-08-30 DIAGNOSIS — J019 Acute sinusitis, unspecified: Secondary | ICD-10-CM | POA: Diagnosis not present

## 2022-08-30 DIAGNOSIS — F4322 Adjustment disorder with anxiety: Secondary | ICD-10-CM | POA: Diagnosis not present

## 2022-09-20 ENCOUNTER — Other Ambulatory Visit: Payer: Self-pay

## 2022-09-20 DIAGNOSIS — E785 Hyperlipidemia, unspecified: Secondary | ICD-10-CM | POA: Diagnosis not present

## 2022-09-20 DIAGNOSIS — R7989 Other specified abnormal findings of blood chemistry: Secondary | ICD-10-CM | POA: Diagnosis not present

## 2022-09-20 DIAGNOSIS — G4700 Insomnia, unspecified: Secondary | ICD-10-CM | POA: Diagnosis not present

## 2022-09-20 DIAGNOSIS — E890 Postprocedural hypothyroidism: Secondary | ICD-10-CM | POA: Diagnosis not present

## 2022-09-20 DIAGNOSIS — E559 Vitamin D deficiency, unspecified: Secondary | ICD-10-CM | POA: Diagnosis not present

## 2022-09-20 DIAGNOSIS — R002 Palpitations: Secondary | ICD-10-CM | POA: Diagnosis not present

## 2022-09-20 DIAGNOSIS — E1165 Type 2 diabetes mellitus with hyperglycemia: Secondary | ICD-10-CM | POA: Diagnosis not present

## 2022-09-20 DIAGNOSIS — M1712 Unilateral primary osteoarthritis, left knee: Secondary | ICD-10-CM | POA: Diagnosis not present

## 2022-09-20 DIAGNOSIS — F4322 Adjustment disorder with anxiety: Secondary | ICD-10-CM | POA: Diagnosis not present

## 2022-09-23 ENCOUNTER — Ambulatory Visit: Payer: PPO | Attending: Cardiology | Admitting: Cardiology

## 2022-09-23 ENCOUNTER — Encounter: Payer: Self-pay | Admitting: Cardiology

## 2022-09-23 VITALS — BP 146/64 | HR 78 | Ht 66.0 in | Wt 177.4 lb

## 2022-09-23 DIAGNOSIS — E785 Hyperlipidemia, unspecified: Secondary | ICD-10-CM | POA: Diagnosis not present

## 2022-09-23 DIAGNOSIS — I2510 Atherosclerotic heart disease of native coronary artery without angina pectoris: Secondary | ICD-10-CM | POA: Diagnosis not present

## 2022-09-23 DIAGNOSIS — I10 Essential (primary) hypertension: Secondary | ICD-10-CM

## 2022-09-23 DIAGNOSIS — E1165 Type 2 diabetes mellitus with hyperglycemia: Secondary | ICD-10-CM

## 2022-09-23 MED ORDER — METOPROLOL SUCCINATE ER 50 MG PO TB24: 50.0000 mg | ORAL_TABLET | Freq: Every day | ORAL | 3 refills | Status: DC

## 2022-09-23 NOTE — Progress Notes (Signed)
Cardiology Office Note:    Date:  09/23/2022   ID:  Cheryl Valenzuela, DOB July 18, 1951, MRN [id number]  PCP:  Lowella Dandy, NP  Cardiologist:  Jenean Lindau, MD   Referring MD: Lowella Dandy, NP    ASSESSMENT:    1. Mild CAD   2. Benign essential hypertension   3. Type 2 diabetes mellitus with hyperglycemia, without long-term current use of insulin (Cheryl Valenzuela)   4. Dyslipidemia    PLAN:    In order of problems listed above:  Coronary artery disease: Secondary prevention stressed with patient.  Importance of compliance with diet medication stressed and she vocalized understanding.  She was advised to walk at least half an hour 5 days a week and she promises to do so. Essential hypertension: Blood pressure stable.  Her insurance company is not going to fill Cardizem so switched it to Toprol-XL 50 mg daily.  She will be keeping a track of her pulse and blood pressure and bring it to [place] in a week after she switches these medications.  Benefits and risks explained. Mixed dyslipidemia: Diet was emphasized.  Lipids reviewed.  Triglycerides are elevated and emphasized the importance of low carb and low fatty food diet. Diabetes mellitus: Managed by primary care.  Diet emphasized.  Weight reduction stressed. Patient will be seen in follow-up appointment in 12 months or earlier if the patient has any concerns    Medication Adjustments/Labs and Tests Ordered: Current medicines are reviewed at length with the patient today.  Concerns regarding medicines are outlined above.  No orders of the defined types were placed in this encounter.  No orders of the defined types were placed in this encounter.    No chief complaint on file.    History of Present Illness:    Cheryl Valenzuela is a 72 y.o. female.  Patient has past medical history of essential hypertension, mixed dyslipidemia and diabetes mellitus.  She denies any problems at this time and takes care of activities of daily living.  No chest pain  orthopnea or PND.  She ambulates well after her knee replacement and is happy with that.  At the time of my evaluation, the patient is alert awake oriented and in no distress.  Past Medical History:  Diagnosis Date   Acute conjunctivitis of right eye    Adult-onset obesity    Allergic contact dermatitis due to plant    Anti-hypertensive induced fatigue    Benign essential hypertension    Cellulitis of umbilicus    Cyst, Baker's knee    DIABETES MELLITUS, TYPE II 06/25/2007   Qualifier: Diagnosis of  By: Loanne Drilling MD, Sean A    Dyslipidemia    Elevated liver function tests    Eustachian catarrh    HYPERTENSION 06/25/2007   Qualifier: Diagnosis of  By: Loanne Drilling MD, Sean A    Hypertension 02/17/2021   Injury of left lower extremity    Insomnia, unspecified    Lymphadenitis    Menopausal disorder    Menopause    Mild CAD 78/58/8502   Nonalcoholic fatty liver disease    Osteoarthritis of right knee    Pain in joint, multiple sites    Palpitations    Pharyngitis    Post-operative hypothyroidism    Postsurgical hypothyroidism 08/02/2007   Qualifier: Diagnosis of  By: Loanne Drilling MD, Sean A    Pre-ulcerative corn or callous    Preop cardiovascular exam 12/23/2021   Seasonal allergies    Sinusitis    Type 2  diabetes mellitus with complication, without long-term current use of insulin (Cheryl Valenzuela) 02/17/2021   Type 2 diabetes mellitus with hyperglycemia, without long-term current use of insulin (Cheryl Valenzuela)    Unspecified hypothyroidism 06/23/2007   Centricity Description: HYPOTHYROIDISM NOS Qualifier: Diagnosis of  By: Loanne Drilling MD, Jacelyn Pi  Centricity Description: HYPOTHYROIDISM Qualifier: Diagnosis of  By: Loanne Drilling MD, Sean A    Varicose veins without complication    Vitamin D deficiency     Past Surgical History:  Procedure Laterality Date   ABDOMINAL HYSTERECTOMY  1990   CHOLECYSTECTOMY  2005   DILATION AND CURETTAGE OF UTERUS     multiple   THYROIDECTOMY  2008    Current  Medications: Current Meds  Medication Sig   aspirin EC 81 MG tablet Take 81 mg by mouth daily. Swallow whole.   atorvastatin (LIPITOR) 10 MG tablet Take 10 mg by mouth daily.   Calcium Carb-Cholecalciferol (CALCIUM-VITAMIN D3) 600-200 MG-UNIT TABS Take 1 tablet by mouth 2 (two) times daily.   Cholecalciferol (VITAMIN D3) 125 MCG (5000 UT) TABS Take 5,000 Units by mouth daily.   diltiazem (CARDIZEM CD) 120 MG 24 hr capsule Take 1 capsule (120 mg total) by mouth daily.   fluticasone (FLONASE) 50 MCG/ACT nasal spray Place 2 sprays into both nostrils daily.   glimepiride (AMARYL) 4 MG tablet Take 4 mg by mouth every morning.   Krill Oil 500 MG CAPS Take 500 mg by mouth daily.   levothyroxine (SYNTHROID) 137 MCG tablet Take 137 mcg by mouth daily.   losartan-hydrochlorothiazide (HYZAAR) 50-12.5 MG tablet Take 1 tablet by mouth daily.   metFORMIN (GLUCOPHAGE-XR) 750 MG 24 hr tablet Take 750 mg by mouth 2 (two) times daily with a meal.   montelukast (SINGULAIR) 10 MG tablet Take 10 mg by mouth at bedtime as needed for allergies.   Multiple Vitamins-Minerals (CENTRUM SILVER PO) Take 1 tablet by mouth daily.   nitroGLYCERIN (NITROSTAT) 0.4 MG SL tablet Place 0.4 mg under the tongue every 5 (five) minutes as needed for chest pain.     Allergies:   Codeine, Lisinopril, Plendil [felodipine], and Sulfa antibiotics   Social History   Socioeconomic History   Marital status: Married    Spouse name: Not on file   Number of children: Not on file   Years of education: Not on file   Highest education level: Not on file  Occupational History   Not on file  Tobacco Use   Smoking status: Never   Smokeless tobacco: Never  Substance and Sexual Activity   Alcohol use: Never   Drug use: Never   Sexual activity: Not on file  Other Topics Concern   Not on file  Social History Narrative   Not on file   Social Determinants of Health   Financial Resource Strain: Not on file  Food Insecurity: Not on  file  Transportation Needs: Not on file  Physical Activity: Not on file  Stress: Not on file  Social Connections: Not on file     Family History: The patient's family history includes Arthritis in her mother and sister; Breast cancer in her mother; Diabetes in her brother, father, mother, and sister; Heart attack in her father; Heart disease in her father; Hypertension in her brother, mother, and sister; Leukemia in her brother; Thyroid disease in her mother.  ROS:   Please see the history of present illness.    All other systems reviewed and are negative.  EKGs/Labs/Other Studies Reviewed:    The following studies were  reviewed today: EKG reveals sinus rhythm and nonspecific ST-T changes   Recent Labs: No results found for requested labs within last 365 days.  Recent Lipid Panel No results found for: "CHOL", "TRIG", "HDL", "CHOLHDL", "VLDL", "LDLCALC", "LDLDIRECT"  Physical Exam:    VS:  BP (!) 146/64   Pulse 78   Ht '5\' 6"'$  (1.676 m)   Wt 177 lb 6.4 oz (80.5 kg)   SpO2 97%   BMI 28.63 kg/m     Wt Readings from Last 3 Encounters:  09/23/22 177 lb 6.4 oz (80.5 kg)  12/23/21 174 lb (78.9 kg)  05/15/21 176 lb (79.8 kg)     GEN: Patient is in no acute distress HEENT: Normal NECK: No JVD; No carotid bruits LYMPHATICS: No lymphadenopathy CARDIAC: Hear sounds regular, 2/6 systolic murmur at the apex. RESPIRATORY:  Clear to auscultation without rales, wheezing or rhonchi  ABDOMEN: Soft, non-tender, non-distended MUSCULOSKELETAL:  No edema; No deformity  SKIN: Warm and dry NEUROLOGIC:  Alert and oriented x 3 PSYCHIATRIC:  Normal affect   Signed, Jenean Lindau, MD  09/23/2022 11:07 AM    [place]

## 2022-09-23 NOTE — Patient Instructions (Addendum)
Medication Instructions:  Your physician has recommended you make the following change in your medication:   Stop Diltazem  Start Toprol XL 50 mg daily.  *If you need a refill on your cardiac medications before your next appointment, please call your pharmacy*   Lab Work: None ordered If you have labs (blood work) drawn today and your tests are completely normal, you will receive your results only by: [facility] (if you have MyChart) OR A paper copy in the mail If you have any lab test that is abnormal or we need to change your treatment, we will call you to review the results.   Testing/Procedures: None ordered   Follow-Up: At [facility], you and your health needs are our priority.  As part of our continuing mission to provide you with exceptional heart care, we have created designated Provider Care Teams.  These Care Teams include your primary Cardiologist (physician) and Advanced Practice Providers (APPs -  Physician Assistants and Nurse Practitioners) who all work together to provide you with the care you need, when you need it.  We recommend signing up for the patient portal called "MyChart".  Sign up information is provided on this After Visit Summary.  MyChart is used to connect with patients for Virtual Visits (Telemedicine).  Patients are able to view lab/test results, encounter notes, upcoming appointments, etc.  Non-urgent messages can be sent to your provider as well.   To learn more about what you can do with MyChart, go to [email or web address].    Your next appointment:   12 month(s)  The format for your next appointment:   In Person  Provider:   Jyl Heinz, MD    Other Instructions none  Important Information About Sugar

## 2022-10-14 DIAGNOSIS — R921 Mammographic calcification found on diagnostic imaging of breast: Secondary | ICD-10-CM | POA: Diagnosis not present

## 2022-10-14 DIAGNOSIS — N6489 Other specified disorders of breast: Secondary | ICD-10-CM | POA: Diagnosis not present

## 2022-10-19 DIAGNOSIS — D242 Benign neoplasm of left breast: Secondary | ICD-10-CM | POA: Diagnosis not present

## 2022-11-18 DIAGNOSIS — H35373 Puckering of macula, bilateral: Secondary | ICD-10-CM | POA: Diagnosis not present

## 2022-11-18 DIAGNOSIS — H2513 Age-related nuclear cataract, bilateral: Secondary | ICD-10-CM | POA: Diagnosis not present

## 2022-11-18 DIAGNOSIS — H35342 Macular cyst, hole, or pseudohole, left eye: Secondary | ICD-10-CM | POA: Diagnosis not present

## 2023-01-18 DIAGNOSIS — M1712 Unilateral primary osteoarthritis, left knee: Secondary | ICD-10-CM | POA: Diagnosis not present

## 2023-02-24 DIAGNOSIS — I10 Essential (primary) hypertension: Secondary | ICD-10-CM | POA: Diagnosis not present

## 2023-02-24 DIAGNOSIS — F4322 Adjustment disorder with anxiety: Secondary | ICD-10-CM | POA: Diagnosis not present

## 2023-02-24 DIAGNOSIS — E890 Postprocedural hypothyroidism: Secondary | ICD-10-CM | POA: Diagnosis not present

## 2023-02-24 DIAGNOSIS — E785 Hyperlipidemia, unspecified: Secondary | ICD-10-CM | POA: Diagnosis not present

## 2023-02-24 DIAGNOSIS — E1165 Type 2 diabetes mellitus with hyperglycemia: Secondary | ICD-10-CM | POA: Diagnosis not present

## 2023-02-24 DIAGNOSIS — M1712 Unilateral primary osteoarthritis, left knee: Secondary | ICD-10-CM | POA: Diagnosis not present

## 2023-04-20 DIAGNOSIS — D242 Benign neoplasm of left breast: Secondary | ICD-10-CM | POA: Diagnosis not present

## 2023-04-20 DIAGNOSIS — R921 Mammographic calcification found on diagnostic imaging of breast: Secondary | ICD-10-CM | POA: Diagnosis not present

## 2023-04-26 DIAGNOSIS — D242 Benign neoplasm of left breast: Secondary | ICD-10-CM | POA: Diagnosis not present

## 2023-05-02 ENCOUNTER — Other Ambulatory Visit: Payer: Self-pay | Admitting: Cardiology

## 2023-07-04 DIAGNOSIS — E785 Hyperlipidemia, unspecified: Secondary | ICD-10-CM | POA: Diagnosis not present

## 2023-07-04 DIAGNOSIS — Z6828 Body mass index (BMI) 28.0-28.9, adult: Secondary | ICD-10-CM | POA: Diagnosis not present

## 2023-07-04 DIAGNOSIS — E559 Vitamin D deficiency, unspecified: Secondary | ICD-10-CM | POA: Diagnosis not present

## 2023-07-04 DIAGNOSIS — M1712 Unilateral primary osteoarthritis, left knee: Secondary | ICD-10-CM | POA: Diagnosis not present

## 2023-07-04 DIAGNOSIS — E890 Postprocedural hypothyroidism: Secondary | ICD-10-CM | POA: Diagnosis not present

## 2023-07-04 DIAGNOSIS — R002 Palpitations: Secondary | ICD-10-CM | POA: Diagnosis not present

## 2023-07-04 DIAGNOSIS — Z23 Encounter for immunization: Secondary | ICD-10-CM | POA: Diagnosis not present

## 2023-07-04 DIAGNOSIS — I10 Essential (primary) hypertension: Secondary | ICD-10-CM | POA: Diagnosis not present

## 2023-07-04 DIAGNOSIS — I2510 Atherosclerotic heart disease of native coronary artery without angina pectoris: Secondary | ICD-10-CM | POA: Diagnosis not present

## 2023-07-04 DIAGNOSIS — E1165 Type 2 diabetes mellitus with hyperglycemia: Secondary | ICD-10-CM | POA: Diagnosis not present

## 2023-07-05 DIAGNOSIS — Z9181 History of falling: Secondary | ICD-10-CM | POA: Diagnosis not present

## 2023-07-05 DIAGNOSIS — Z0000 Encounter for general adult medical examination without abnormal findings: Secondary | ICD-10-CM | POA: Diagnosis not present

## 2023-07-14 DIAGNOSIS — E119 Type 2 diabetes mellitus without complications: Secondary | ICD-10-CM | POA: Diagnosis not present

## 2023-08-25 DIAGNOSIS — H35373 Puckering of macula, bilateral: Secondary | ICD-10-CM | POA: Diagnosis not present

## 2023-09-12 DIAGNOSIS — G510 Bell's palsy: Secondary | ICD-10-CM | POA: Diagnosis not present

## 2023-09-16 DIAGNOSIS — E1165 Type 2 diabetes mellitus with hyperglycemia: Secondary | ICD-10-CM | POA: Diagnosis not present

## 2023-09-16 DIAGNOSIS — G510 Bell's palsy: Secondary | ICD-10-CM | POA: Diagnosis not present

## 2023-09-21 ENCOUNTER — Encounter: Payer: Self-pay | Admitting: Cardiology

## 2023-09-21 ENCOUNTER — Ambulatory Visit: Payer: PPO | Attending: Cardiology | Admitting: Cardiology

## 2023-09-21 VITALS — BP 142/60 | HR 63 | Ht 65.0 in | Wt 173.4 lb

## 2023-09-21 DIAGNOSIS — E1165 Type 2 diabetes mellitus with hyperglycemia: Secondary | ICD-10-CM

## 2023-09-21 DIAGNOSIS — I10 Essential (primary) hypertension: Secondary | ICD-10-CM

## 2023-09-21 DIAGNOSIS — E785 Hyperlipidemia, unspecified: Secondary | ICD-10-CM | POA: Diagnosis not present

## 2023-09-21 DIAGNOSIS — I2510 Atherosclerotic heart disease of native coronary artery without angina pectoris: Secondary | ICD-10-CM

## 2023-09-21 NOTE — Progress Notes (Signed)
 Cardiology Office Note:    Date:  09/21/2023   ID:  Cheryl Valenzuela, DOB April 04, 1951, MRN [id number]  PCP:  Erick Greig LABOR, NP  Cardiologist:  Jennifer JONELLE Crape, MD   Referring MD: Erick Greig LABOR, NP    ASSESSMENT:    1. Dyslipidemia   2. Mild CAD   3. Benign essential hypertension   4. Type 2 diabetes mellitus with hyperglycemia, without long-term current use of insulin (HCC)    PLAN:    In order of problems listed above:  Elevated calcium score: Secondary prevention stressed to the patient.  Importance of compliance with diet medication stressed and she vocalized understanding.  She was advised to walk at least half an hour a day 5 days a week and she promises to do so. Essential hypertension: Blood pressure is stable and diet was emphasized.  She is blood blood pressure readings from home and they are fine.  She has an element of whitecoat hypertension.  Lifestyle modification urged. Mixed dyslipidemia: Lipids are fine but triglycerides are elevated.  I counseled her about diet and she promises to do better.  She will have blood work followed by primary care. Diabetes mellitus: Patient's hemoglobin A1c is mildly elevated.  She is on steroid therapy for Bell's palsy.  I discussed this and educated her about it. Patient will be seen in follow-up appointment in 6 months or earlier if the patient has any concerns.    Medication Adjustments/Labs and Tests Ordered: Current medicines are reviewed at length with the patient today.  Concerns regarding medicines are outlined above.  Orders Placed This Encounter  Procedures   EKG 12-Lead   No orders of the defined types were placed in this encounter.    No chief complaint on file.    History of Present Illness:    Cheryl Valenzuela is a 73 y.o. female.  Patient has past medical history of elevated calcium score, essential hypertension, mixed dyslipidemia and diabetes mellitus.  Overall she leads a sedentary lifestyle.  She tells me that she has  been spells.  And is being treated for it.  No chest pain orthopnea or PND.  At the time of my evaluation, the patient is alert awake oriented and in no distress.  Past Medical History:  Diagnosis Date   Acute conjunctivitis of right eye    Adult-onset obesity    Allergic contact dermatitis due to plant    Anti-hypertensive induced fatigue    Benign essential hypertension    Cellulitis of umbilicus    Cyst, Baker's knee    DIABETES MELLITUS, TYPE II 06/25/2007   Qualifier: Diagnosis of  By: Kassie MD, Alyce A    Dyslipidemia    Elevated liver function tests    Essential hypertension 06/25/2007   Qualifier: Diagnosis of   By: Kassie MD, Alyce LABOR     IMO SNOMED Dx Update Oct 2024     Eustachian catarrh    HYPERTENSION 06/25/2007   Qualifier: Diagnosis of  By: Kassie MD, Sean A    Hypertension 02/17/2021   Hypothyroidism 06/23/2007   Centricity Description: HYPOTHYROIDISM NOS  Qualifier: Diagnosis of   By: Kassie MD, Alyce LABOR     Centricity Description: HYPOTHYROIDISM  Qualifier: Diagnosis of   By: Kassie MD, Alyce LABOR     IMO SNOMED Dx Update Oct 2024     Injury of left lower extremity    Insomnia, unspecified    Lymphadenitis    Menopausal disorder    Menopause  Mild CAD 02/17/2021   Nonalcoholic fatty liver disease    Osteoarthritis of right knee    Pain in joint, multiple sites    Palpitations    Pharyngitis    Post-operative hypothyroidism    Postsurgical hypothyroidism 08/02/2007   Qualifier: Diagnosis of  By: Kassie MD, Sean A    Pre-ulcerative corn or callous    Preop cardiovascular exam 12/23/2021   Seasonal allergies    Sinusitis    Type 2 diabetes mellitus with complication, without long-term current use of insulin (HCC) 02/17/2021   Type 2 diabetes mellitus with hyperglycemia, without long-term current use of insulin (HCC)    Unspecified hypothyroidism 06/23/2007   Centricity Description: HYPOTHYROIDISM NOS Qualifier: Diagnosis of  By: Kassie MD, Alyce LABOR   Centricity Description: HYPOTHYROIDISM Qualifier: Diagnosis of  By: Kassie MD, Sean A    Varicose veins without complication    Vitamin D deficiency     Past Surgical History:  Procedure Laterality Date   ABDOMINAL HYSTERECTOMY  1990   CHOLECYSTECTOMY  2005   DILATION AND CURETTAGE OF UTERUS     multiple   THYROIDECTOMY  2008    Current Medications: Current Meds  Medication Sig   aspirin EC 81 MG tablet Take 81 mg by mouth daily. Swallow whole.   atorvastatin (LIPITOR) 10 MG tablet Take 10 mg by mouth daily.   Calcium Carb-Cholecalciferol (CALCIUM-VITAMIN D3) 600-200 MG-UNIT TABS Take 1 tablet by mouth 2 (two) times daily.   cholecalciferol (VITAMIN D3) 25 MCG (1000 UNIT) tablet Take 1,000 Units by mouth daily.   Coenzyme Q10 (CO Q-10) 200 MG CAPS Take 200 mg by mouth daily.   diltiazem  (CARDIZEM  CD) 120 MG 24 hr capsule TAKE 1 CAPSULE BY MOUTH EVERY DAY   Krill Oil 500 MG CAPS Take 500 mg by mouth daily.   levothyroxine (SYNTHROID) 137 MCG tablet Take 137 mcg by mouth daily.   losartan-hydrochlorothiazide (HYZAAR) 50-12.5 MG tablet Take 1 tablet by mouth daily.   metFORMIN (GLUCOPHAGE-XR) 750 MG 24 hr tablet Take 750 mg by mouth 2 (two) times daily with a meal.   montelukast (SINGULAIR) 10 MG tablet Take 10 mg by mouth at bedtime as needed for allergies.   Multiple Vitamins-Minerals (CENTRUM SILVER PO) Take 1 tablet by mouth daily.   nitroGLYCERIN  (NITROSTAT ) 0.4 MG SL tablet Place 0.4 mg under the tongue every 5 (five) minutes as needed for chest pain.     Allergies:   Codeine, Lisinopril, Plendil [felodipine], and Sulfa antibiotics   Social History   Socioeconomic History   Marital status: Married    Spouse name: Not on file   Number of children: Not on file   Years of education: Not on file   Highest education level: Not on file  Occupational History   Not on file  Tobacco Use   Smoking status: Never   Smokeless tobacco: Never  Substance and Sexual Activity    Alcohol use: Never   Drug use: Never   Sexual activity: Not on file  Other Topics Concern   Not on file  Social History Narrative   Not on file   Social Drivers of Health   Financial Resource Strain: Not on file  Food Insecurity: Not on file  Transportation Needs: Not on file  Physical Activity: Not on file  Stress: Not on file  Social Connections: Not on file     Family History: The patient's family history includes Arthritis in her mother and sister; Breast cancer in her mother; Diabetes  in her brother, father, mother, and sister; Heart attack in her father; Heart disease in her father; Hypertension in her brother, mother, and sister; Leukemia in her brother; Thyroid  disease in her mother.  ROS:   Please see the history of present illness.    All other systems reviewed and are negative.  EKGs/Labs/Other Studies Reviewed:    The following studies were reviewed today: .SABRAEKG Interpretation Date/Time:  Wednesday September 21 2023 10:55:29 EST Ventricular Rate:  63 PR Interval:  146 QRS Duration:  134 QT Interval:  442 QTC Calculation: 452 R Axis:   60  Text Interpretation: Normal sinus rhythm Right bundle branch block T wave abnormality, consider inferior ischemia Abnormal ECG No previous ECGs available Confirmed by Edwyna Backers [phone]) on 09/21/2023 11:04:34 AM     Recent Labs: No results found for requested labs within last 365 days.  Recent Lipid Panel No results found for: CHOL, TRIG, HDL, CHOLHDL, VLDL, LDLCALC, LDLDIRECT  Physical Exam:    VS:  BP (!) 142/60   Pulse 63   Ht 5' 5 (1.651 m)   Wt 173 lb 6.4 oz (78.7 kg)   SpO2 97%   BMI 28.86 kg/m     Wt Readings from Last 3 Encounters:  09/21/23 173 lb 6.4 oz (78.7 kg)  09/23/22 177 lb 6.4 oz (80.5 kg)  12/23/21 174 lb (78.9 kg)     GEN: Patient is in no acute distress HEENT: Normal NECK: No JVD; No carotid bruits LYMPHATICS: No lymphadenopathy CARDIAC: Hear sounds regular, 2/6  systolic murmur at the apex. RESPIRATORY:  Clear to auscultation without rales, wheezing or rhonchi  ABDOMEN: Soft, non-tender, non-distended MUSCULOSKELETAL:  No edema; No deformity  SKIN: Warm and dry NEUROLOGIC:  Alert and oriented x 3 PSYCHIATRIC:  Normal affect   Signed, Backers JONELLE Edwyna, MD  09/21/2023 11:15 AM    [facility]

## 2023-09-21 NOTE — Patient Instructions (Signed)

## 2023-09-23 ENCOUNTER — Other Ambulatory Visit: Payer: Self-pay | Admitting: Cardiology

## 2023-10-24 DIAGNOSIS — Z1231 Encounter for screening mammogram for malignant neoplasm of breast: Secondary | ICD-10-CM | POA: Diagnosis not present

## 2023-10-25 DIAGNOSIS — Z1231 Encounter for screening mammogram for malignant neoplasm of breast: Secondary | ICD-10-CM | POA: Diagnosis not present

## 2023-11-10 DIAGNOSIS — D242 Benign neoplasm of left breast: Secondary | ICD-10-CM | POA: Diagnosis not present

## 2023-11-11 DIAGNOSIS — E663 Overweight: Secondary | ICD-10-CM | POA: Diagnosis not present

## 2023-11-11 DIAGNOSIS — E890 Postprocedural hypothyroidism: Secondary | ICD-10-CM | POA: Diagnosis not present

## 2023-11-11 DIAGNOSIS — E785 Hyperlipidemia, unspecified: Secondary | ICD-10-CM | POA: Diagnosis not present

## 2023-11-11 DIAGNOSIS — E559 Vitamin D deficiency, unspecified: Secondary | ICD-10-CM | POA: Diagnosis not present

## 2023-11-11 DIAGNOSIS — G4700 Insomnia, unspecified: Secondary | ICD-10-CM | POA: Diagnosis not present

## 2023-11-11 DIAGNOSIS — R7989 Other specified abnormal findings of blood chemistry: Secondary | ICD-10-CM | POA: Diagnosis not present

## 2023-11-11 DIAGNOSIS — E1165 Type 2 diabetes mellitus with hyperglycemia: Secondary | ICD-10-CM | POA: Diagnosis not present

## 2023-11-11 DIAGNOSIS — I2510 Atherosclerotic heart disease of native coronary artery without angina pectoris: Secondary | ICD-10-CM | POA: Diagnosis not present

## 2023-11-11 DIAGNOSIS — R002 Palpitations: Secondary | ICD-10-CM | POA: Diagnosis not present

## 2023-11-11 DIAGNOSIS — M1712 Unilateral primary osteoarthritis, left knee: Secondary | ICD-10-CM | POA: Diagnosis not present

## 2023-11-11 DIAGNOSIS — I10 Essential (primary) hypertension: Secondary | ICD-10-CM | POA: Diagnosis not present

## 2023-11-11 DIAGNOSIS — Z6828 Body mass index (BMI) 28.0-28.9, adult: Secondary | ICD-10-CM | POA: Diagnosis not present

## 2024-01-18 DIAGNOSIS — M1712 Unilateral primary osteoarthritis, left knee: Secondary | ICD-10-CM | POA: Diagnosis not present

## 2024-02-20 DIAGNOSIS — B9689 Other specified bacterial agents as the cause of diseases classified elsewhere: Secondary | ICD-10-CM | POA: Diagnosis not present

## 2024-02-20 DIAGNOSIS — M542 Cervicalgia: Secondary | ICD-10-CM | POA: Diagnosis not present

## 2024-02-20 DIAGNOSIS — E1165 Type 2 diabetes mellitus with hyperglycemia: Secondary | ICD-10-CM | POA: Diagnosis not present

## 2024-02-20 DIAGNOSIS — J019 Acute sinusitis, unspecified: Secondary | ICD-10-CM | POA: Diagnosis not present

## 2024-02-20 DIAGNOSIS — H9201 Otalgia, right ear: Secondary | ICD-10-CM | POA: Diagnosis not present

## 2024-02-23 DIAGNOSIS — D3131 Benign neoplasm of right choroid: Secondary | ICD-10-CM | POA: Diagnosis not present

## 2024-03-08 DIAGNOSIS — G4700 Insomnia, unspecified: Secondary | ICD-10-CM | POA: Diagnosis not present

## 2024-03-08 DIAGNOSIS — E559 Vitamin D deficiency, unspecified: Secondary | ICD-10-CM | POA: Diagnosis not present

## 2024-03-08 DIAGNOSIS — E890 Postprocedural hypothyroidism: Secondary | ICD-10-CM | POA: Diagnosis not present

## 2024-03-08 DIAGNOSIS — M1712 Unilateral primary osteoarthritis, left knee: Secondary | ICD-10-CM | POA: Diagnosis not present

## 2024-03-08 DIAGNOSIS — R7989 Other specified abnormal findings of blood chemistry: Secondary | ICD-10-CM | POA: Diagnosis not present

## 2024-03-08 DIAGNOSIS — E785 Hyperlipidemia, unspecified: Secondary | ICD-10-CM | POA: Diagnosis not present

## 2024-03-08 DIAGNOSIS — I10 Essential (primary) hypertension: Secondary | ICD-10-CM | POA: Diagnosis not present

## 2024-03-08 DIAGNOSIS — Z6826 Body mass index (BMI) 26.0-26.9, adult: Secondary | ICD-10-CM | POA: Diagnosis not present

## 2024-03-08 DIAGNOSIS — E1165 Type 2 diabetes mellitus with hyperglycemia: Secondary | ICD-10-CM | POA: Diagnosis not present

## 2024-03-08 DIAGNOSIS — E663 Overweight: Secondary | ICD-10-CM | POA: Diagnosis not present

## 2024-03-08 DIAGNOSIS — R002 Palpitations: Secondary | ICD-10-CM | POA: Diagnosis not present

## 2024-03-08 DIAGNOSIS — I2510 Atherosclerotic heart disease of native coronary artery without angina pectoris: Secondary | ICD-10-CM | POA: Diagnosis not present

## 2024-03-24 ENCOUNTER — Other Ambulatory Visit: Payer: Self-pay | Admitting: Cardiology

## 2024-04-05 DIAGNOSIS — I2510 Atherosclerotic heart disease of native coronary artery without angina pectoris: Secondary | ICD-10-CM | POA: Diagnosis not present

## 2024-04-05 DIAGNOSIS — E1165 Type 2 diabetes mellitus with hyperglycemia: Secondary | ICD-10-CM | POA: Diagnosis not present

## 2024-04-05 DIAGNOSIS — R002 Palpitations: Secondary | ICD-10-CM | POA: Diagnosis not present

## 2024-04-05 DIAGNOSIS — R55 Syncope and collapse: Secondary | ICD-10-CM | POA: Diagnosis not present

## 2024-04-05 DIAGNOSIS — I10 Essential (primary) hypertension: Secondary | ICD-10-CM | POA: Diagnosis not present

## 2024-04-05 DIAGNOSIS — E890 Postprocedural hypothyroidism: Secondary | ICD-10-CM | POA: Diagnosis not present

## 2024-04-05 DIAGNOSIS — R42 Dizziness and giddiness: Secondary | ICD-10-CM | POA: Diagnosis not present

## 2024-04-17 DIAGNOSIS — I2510 Atherosclerotic heart disease of native coronary artery without angina pectoris: Secondary | ICD-10-CM | POA: Diagnosis not present

## 2024-04-17 DIAGNOSIS — R42 Dizziness and giddiness: Secondary | ICD-10-CM | POA: Diagnosis not present

## 2024-04-17 DIAGNOSIS — I10 Essential (primary) hypertension: Secondary | ICD-10-CM | POA: Diagnosis not present

## 2024-04-17 DIAGNOSIS — R55 Syncope and collapse: Secondary | ICD-10-CM | POA: Diagnosis not present

## 2024-04-26 DIAGNOSIS — M85851 Other specified disorders of bone density and structure, right thigh: Secondary | ICD-10-CM | POA: Diagnosis not present

## 2024-04-26 DIAGNOSIS — M8589 Other specified disorders of bone density and structure, multiple sites: Secondary | ICD-10-CM | POA: Diagnosis not present

## 2024-04-26 DIAGNOSIS — M85852 Other specified disorders of bone density and structure, left thigh: Secondary | ICD-10-CM | POA: Diagnosis not present

## 2024-05-01 DIAGNOSIS — M8589 Other specified disorders of bone density and structure, multiple sites: Secondary | ICD-10-CM | POA: Diagnosis not present

## 2024-06-30 ENCOUNTER — Other Ambulatory Visit: Payer: Self-pay | Admitting: Cardiology

## 2024-07-09 DIAGNOSIS — I10 Essential (primary) hypertension: Secondary | ICD-10-CM | POA: Diagnosis not present

## 2024-07-09 DIAGNOSIS — E890 Postprocedural hypothyroidism: Secondary | ICD-10-CM | POA: Diagnosis not present

## 2024-07-09 DIAGNOSIS — R7989 Other specified abnormal findings of blood chemistry: Secondary | ICD-10-CM | POA: Diagnosis not present

## 2024-07-09 DIAGNOSIS — E559 Vitamin D deficiency, unspecified: Secondary | ICD-10-CM | POA: Diagnosis not present

## 2024-07-09 DIAGNOSIS — E1165 Type 2 diabetes mellitus with hyperglycemia: Secondary | ICD-10-CM | POA: Diagnosis not present

## 2024-07-09 DIAGNOSIS — Z6827 Body mass index (BMI) 27.0-27.9, adult: Secondary | ICD-10-CM | POA: Diagnosis not present

## 2024-07-09 DIAGNOSIS — E785 Hyperlipidemia, unspecified: Secondary | ICD-10-CM | POA: Diagnosis not present

## 2024-07-09 DIAGNOSIS — R002 Palpitations: Secondary | ICD-10-CM | POA: Diagnosis not present

## 2024-07-09 DIAGNOSIS — I2510 Atherosclerotic heart disease of native coronary artery without angina pectoris: Secondary | ICD-10-CM | POA: Diagnosis not present

## 2024-07-09 DIAGNOSIS — G4700 Insomnia, unspecified: Secondary | ICD-10-CM | POA: Diagnosis not present

## 2024-07-09 DIAGNOSIS — M1712 Unilateral primary osteoarthritis, left knee: Secondary | ICD-10-CM | POA: Diagnosis not present

## 2024-07-09 DIAGNOSIS — Z23 Encounter for immunization: Secondary | ICD-10-CM | POA: Diagnosis not present

## 2024-07-10 ENCOUNTER — Telehealth: Payer: Self-pay | Admitting: Cardiology

## 2024-07-10 NOTE — Telephone Encounter (Signed)
 Called pt to schedule recall, pt advised she has moved to a alternate cardiology group in the area and would no longer be following our care
